# Patient Record
Sex: Male | Born: 1955 | Race: White | Hispanic: No | Marital: Single | Smoking: Former smoker
Health system: Southern US, Community
[De-identification: ages and names within clinical notes are randomized; demographics above are authoritative.]

## PROBLEM LIST (undated history)

## (undated) DIAGNOSIS — J40 Bronchitis, not specified as acute or chronic: Secondary | ICD-10-CM

## (undated) DIAGNOSIS — I10 Essential (primary) hypertension: Secondary | ICD-10-CM

## (undated) DIAGNOSIS — G4700 Insomnia, unspecified: Secondary | ICD-10-CM

## (undated) DIAGNOSIS — E559 Vitamin D deficiency, unspecified: Secondary | ICD-10-CM

## (undated) DIAGNOSIS — E782 Mixed hyperlipidemia: Secondary | ICD-10-CM

## (undated) DIAGNOSIS — J329 Chronic sinusitis, unspecified: Secondary | ICD-10-CM

## (undated) HISTORY — DX: Mixed hyperlipidemia: E78.2

## (undated) HISTORY — DX: Essential (primary) hypertension: I10

## (undated) HISTORY — DX: Vitamin D deficiency, unspecified: E55.9

## (undated) HISTORY — DX: Insomnia, unspecified: G47.00

## (undated) HISTORY — DX: Chronic sinusitis, unspecified: J32.9

## (undated) HISTORY — PX: ROTATOR CUFF REPAIR: SHX139

## (undated) HISTORY — DX: Bronchitis, not specified as acute or chronic: J40

---

## 2000-11-09 ENCOUNTER — Emergency Department (HOSPITAL_COMMUNITY): Admission: EM | Admit: 2000-11-09 | Discharge: 2000-11-10 | Payer: Self-pay | Admitting: *Deleted

## 2000-11-10 ENCOUNTER — Encounter: Payer: Self-pay | Admitting: Emergency Medicine

## 2000-11-22 ENCOUNTER — Inpatient Hospital Stay (HOSPITAL_COMMUNITY): Admission: RE | Admit: 2000-11-22 | Discharge: 2000-11-25 | Payer: Self-pay | Admitting: Specialist

## 2000-11-22 ENCOUNTER — Encounter: Payer: Self-pay | Admitting: Specialist

## 2005-02-05 ENCOUNTER — Ambulatory Visit (HOSPITAL_COMMUNITY): Admission: RE | Admit: 2005-02-05 | Discharge: 2005-02-05 | Payer: Self-pay | Admitting: Family Medicine

## 2005-02-24 ENCOUNTER — Encounter: Admission: RE | Admit: 2005-02-24 | Discharge: 2005-02-24 | Payer: Self-pay | Admitting: Neurosurgery

## 2005-03-08 ENCOUNTER — Encounter: Admission: RE | Admit: 2005-03-08 | Discharge: 2005-03-08 | Payer: Self-pay | Admitting: Neurosurgery

## 2005-03-26 ENCOUNTER — Encounter: Admission: RE | Admit: 2005-03-26 | Discharge: 2005-03-26 | Payer: Self-pay | Admitting: Neurosurgery

## 2008-03-01 ENCOUNTER — Ambulatory Visit (HOSPITAL_COMMUNITY): Admission: RE | Admit: 2008-03-01 | Discharge: 2008-03-01 | Payer: Self-pay | Admitting: Gastroenterology

## 2008-03-01 ENCOUNTER — Encounter (INDEPENDENT_AMBULATORY_CARE_PROVIDER_SITE_OTHER): Payer: Self-pay | Admitting: Gastroenterology

## 2010-08-04 NOTE — Op Note (Signed)
NAME:  Hector Ryan, Hector Ryan                 ACCOUNT NO.:  [id number]   MEDICAL RECORD NO.:  [id number]          PATIENT TYPE:  AMB   LOCATION:  ENDO                         FACILITY:  [facility]   PHYSICIAN:  John C. Madilyn Fireman, M.D.    DATE OF BIRTH:  Jan 12, 1956   DATE OF PROCEDURE:  03/01/2008  DATE OF DISCHARGE:                               OPERATIVE REPORT   INDICATIONS FOR PROCEDURE:  History of adenomatous colon polyps with  last colonoscopy 5 years ago.   PROCEDURE:  The patient was placed in the left lateral decubitus  position and placed on the pulse monitor with continuous low-flow oxygen  delivered by nasal cannula.  He was sedated with 100 mcg IV fentanyl and  10 mg IV Versed.  Olympus video colonoscope was inserted into the rectum  and advanced to the cecum, confirmed by transillumination of McBurney's  point and visualization of ileocecal valve and appendiceal orifice.  The  prep was excellent.  In the cecum, there was a 4-mm polyp that was  fulgurated by hot biopsy.  The ascending, transverse, descending, and  sigmoid colon appeared normal with no further polyps, masses,  diverticula or other mucosal abnormalities.  At the rectosigmoid  junction, there were 2 small 4-mm polyps that were both fulgurated by  hot biopsy.  The scope was then withdrawn.  The patient returned to the  recovery room in stable condition.  He tolerated the procedure well, and  there were no immediate complications.   IMPRESSION:  Three colon polyps.   PLAN:  Await histology and will probably repeat colonoscopy in 3-5  years.           ______________________________  Everardo All Madilyn Fireman, M.D.     J[facility]/MEDQ  D:  03/01/2008  T:  03/02/2008  Job:  [id number]   cc:   Bryan Lemma. Manus Gunning, M.D.  Fax: [phone]

## 2010-08-07 NOTE — Op Note (Signed)
[place]. [facility]  Patient:    Hector Ryan, Hector Ryan Visit Number: [id number] MRN: [id number]          Service Type: SUR Location: 5000 [id number] Attending Physician:  Erasmo Leventhal Dictated by:   R. Valma Cava, M.D. Proc. Date: 11/22/00 Admit Date:  11/22/2000                             Operative Report  PREOPERATIVE DIAGNOSIS:  Right shoulder comminuted displaced unstable mid one-third diaphyseal fracture.  POSTOPERATIVE DIAGNOSIS:  Right shoulder comminuted displaced unstable mid one-third diaphyseal fracture.  OPERATION:  Open reduction internal fixation of the right clavicle fracture utilizing Dupuy intramedullary clavicle pins.  SURGEON:  R. Valma Cava, M.D.  ASSISTANT:  Dorie Rank, P.A.  ANESTHESIA:  General.  ESTIMATED BLOOD LOSS:  Less than 50 cc.  DRAINS:  None.  COMPLICATIONS:  None.  DISPOSITION:  To PACU stable.  DESCRIPTION OF PROCEDURE:  The patient was counseled in the holding area and correct shoulder was identified.  Intravenous shoulder block was given.  I again went through the pros and cons of the surgery with the patient, and he adamantly wanted to continue along with open reduction and internal fixation despite the increasing risks and potential complications.  Based on this, he was then taking to the operating room and placed on the operating room table under general anesthesia.  He was then placed in modified beach chair position.  The right shoulder was examined.  The clavicle was beginning to heal.  He had callus in place.  The C-arm was brought in from the head of the table to make sure that we could see the fracture site, and we could.  Prepped with DuraPrep and draped in a sterile fashion.  The overlying skin was anesthetized with Marcaine with epinephrine.  A longitudinal incision was made based upon the clavicle.  The skin and subcutaneous tissues and skin flaps were mobilized. The platysma  muscle was identified.  It was opened longitudinally in its fibers.  The middle supraclavicular nerve was protected throughout the entire case and retracted out of the way.  At this point in time, there was fracture of callous, early flap wound was present, and a meticulous dissection was done around a medial fragment.  It was markedly comminuted, and also there was one fragment turned 90 degrees on itself and entrapped in soft tissue.  It was gently mobilized leaving as much soft tissue attachment as possible.  Laterally, the lateral fragment was also identified and mobilized.  Throughout the entire time, the neurovascular bundles and the close proximity of these major vessel structures were thought of and protected.  The subperiosteum and subclavius muscle were used to protect the neurovascular structures.  A curet was then utilized to open the canal of the medial fragment.  I checked it with the C-arm.  I then made sure it was in the canal and then up to the third reamer to the appropriate depth under direct visualization hand reaming this very slowly and cautiously making sure it did not penetrate the cortex, and I did not.  Attention was then directed to the lateral side where the same size drill bit was then drilled through this and out through a posterior lateral puncture wound.  In addition to the medial side, we had tapped that to the appropriate size to the appropriate level.  All of this was done under image intensification  guidance.  The appropriate size pin was then drilled from medial to lateral at the proximal fragment at the puncture wound.  The fracture was reduced as anatomically as possibly and then it was advanced back across the medial fragment given a nice stable fixation.  It was taken to the appropriate depth and again checked with the C-arm.  The fracture was reduced as well as possible regaining the length of the fracture.  The two bolts were then applied  utilizing standard fashion laterally to help assist this and an ancillary incision made and split the trapezius.  These were taken down to the clavicle where there was compression at the fracture site and then the other locking bolt was applied to this for a cold weld.  The shoulder was put through a gentle range of motion.  It appeared to be stable. The end of the pin was then cut underneath the skin.  At this time, three Vicryl sutures were placed around the clavicle and then the butterfly fragment was sutured back to the clavicle trying to close this down as tightly as possible.  The overlying periosteum and muscular periosteal cup was then reattached with Vicryl around the fracture site.  The platysma was closed with Vicryl and the supraclavicular nerves were then protected.  The subcu was closed with Vicryl, and the skin with staples.  The other two wounds were also closed with subcu Vicryl.  Skin closed with staples.  Another 20 cc of 0.5% Marcaine with epinephrine was placed at the wound edges to help with healing postoperatively.  Another 1 g of Ancef was given intravenously.  He had a normal pulse in the hand at the end of the case.  Sterile compressive dressing was applied to the shoulder, placed in shoulder immobilizer _____ across his abdomen.  Pulses were symmetric.  There were no apparent complications. Follow-up x-rays will be obtained in the PACU.  In addition, we got intraoperative x-rays to confirm excellent reduction of the fracture and placement of the implant.  No complications.  He was gently awakened and taken from the operating room to recovery in stable condition.    He was then taken up to his room for observation. Dictated by:   R. Valma Cava, M.D. Attending Physician:  Erasmo Leventhal DD:  11/22/00 TD:  11/22/00 Job: [id number]

## 2010-08-07 NOTE — Discharge Summary (Signed)
[place]. [facility]  Patient:    GIORGI, DEBRUIN Visit Number: [id number] MRN: [id number]          Service Type: MED Location: 5000 [id number] Attending Physician:  Erasmo Leventhal Dictated by:   Dorie Rank, P.A.C. Admit Date:  11/22/2000 Discharge Date: 11/25/2000                             Discharge Summary  ADMISSION DIAGNOSES: 1. Right clavicle fracture. 2. Wears contact lenses.  DISCHARGE DIAGNOSES: 1. Right clavicle fracture. 2. Wears contact lenses.  PROCEDURE:  On November 22, 2000, the patient underwent an open reduction and internal fixation of the right clavicle fracture utilizing DePuy intramedullary clavicle pins.  Surgeon was R. Valma Cava, M.D., assistant Dorie Rank, P.A.C.  Anesthesia was general.  COMPLICATIONS:  None.  CONSULTS:  None.  HISTORY OF PRESENT ILLNESS:  Mr. Nafziger is a pleasant 55 year old  male who has history of a right clavicle fracture.  He stated in the office that he wanted a reduction due to the instability of the fracture.  In the office he was counseled on the pros and cons of an ORIF versus conservative measures with immobilization.  The patient wished to proceed with an ORIF of the right clavicle fracture.  HOSPITAL COURSE:  The patient was admitted on November 22, 2000, and had the above stated procedure.  He tolerated surgery well.  While in the operating room the incision was dressed in a sterile fashion.  On postop day #1, the dressing was clean, dry and intact. The dressing was changed on postop day #2 and #3 and found to be free of any erythema or drainage.  Initially for pain control, he utilized a PCA of Marcaine.  He was weaned off of this and by postop day #3 he was utilizing p.o.s for pain control.  A regular diet was resumed postoperatively.  He tolerated p.o. intake well.  Ice packs were applied to his shoulder and changed q.2h.  He utilized the bedside incentive spirometer q. hour  7 a.m. to 10 p.m.  for 10 minutes.  A CBC and BMET were obtained on postop day #1 and he was found to be stable.  Neurovascular checks and vitals were obtained routinely per postop routine.  IV Ancef was used for postop infection control.  Occupational therapy worked with the patient on ADLs and pendulum exercises to the right shoulder. He utilized a sling to the right shoulder.  His elbow was kept elevated on a pillow while in bed. Calcium as well as vitamin C and vitamin D were started postoperatively.  By postop day #3, the patient was felt to be medically and orthopedically stable for discharge.  LABS:  CBC on November 22, 2000, was within normal limits.  CBC on November 23, 2000, was within normal limits.  Coag studies on November 22, 2000, including PT, INR and PTT were within normal limits.  BMET on November 22, 2000, was within normal limits.  BMET on November 23, 2000, was within normal limits.  EKG on November 22, 2000, tracings revealed sinus rhythm with marked sinus arrhythmias otherwise normal.  This was confirmed by Dr. Olga Millers.  RADIOLOGY:  A 2-view chest x-ray on November 10, 2000, revealed no acute cardiopulmonary findings.  On November 22, 2000, intraoperative C-arm fluoroscopy views revealed open reduction and internal fixation right clavicle fracture.  Postoperative clavicle films on November 22, 2000, revealed status  post open reduction and internal fixation right clavicle fracture.  CONDITION ON DISCHARGE:  Improved.  DISCHARGE PLANS AND MEDICATIONS:  The patient was discharged home.  Daily dressing changes.  He was to keep his arm in a sling.  No motion to the right shoulder except pendulum exercises only.  He was given prescriptions for Percocet and Robaxin.  He was instructed to take over-the-counter vitamin C 1000 mg one p.o. q.d. and calcium 500 Plus D one p.o. t.i.d.  He is to follow up with Dr. Thomasena Edis in two weeks, call for an appointment, Tylenol  for fever. He is educated, he needs to  __________ his smoking.  He could shower on postop day #5. Dictated by:   Dorie Rank, P.A.C. Attending Physician:  Erasmo Leventhal DD:  12/26/00 TD:  12/26/00 Job: [id number]

## 2011-07-19 ENCOUNTER — Telehealth: Payer: Self-pay

## 2011-07-19 NOTE — Telephone Encounter (Signed)
LMOM that we do not have a record of a tdap for him.

## 2011-07-19 NOTE — Telephone Encounter (Signed)
Pt needs to know last tetanus date   Best phone [phone]

## 2012-12-29 ENCOUNTER — Other Ambulatory Visit: Payer: Self-pay | Admitting: Occupational Medicine

## 2012-12-29 ENCOUNTER — Ambulatory Visit: Payer: Self-pay

## 2012-12-29 DIAGNOSIS — R52 Pain, unspecified: Secondary | ICD-10-CM

## 2013-01-05 ENCOUNTER — Other Ambulatory Visit: Payer: Self-pay | Admitting: Occupational Medicine

## 2013-01-05 ENCOUNTER — Ambulatory Visit: Payer: Self-pay

## 2013-01-05 DIAGNOSIS — M549 Dorsalgia, unspecified: Secondary | ICD-10-CM

## 2013-03-13 ENCOUNTER — Other Ambulatory Visit: Payer: Self-pay | Admitting: Gastroenterology

## 2015-05-15 ENCOUNTER — Encounter: Payer: Self-pay | Admitting: Neurology

## 2015-05-15 ENCOUNTER — Ambulatory Visit (INDEPENDENT_AMBULATORY_CARE_PROVIDER_SITE_OTHER): Payer: BLUE CROSS/BLUE SHIELD | Admitting: Neurology

## 2015-05-15 VITALS — BP 140/88 | HR 82 | Resp 20 | Ht 66.0 in | Wt 212.0 lb

## 2015-05-15 DIAGNOSIS — G4710 Hypersomnia, unspecified: Secondary | ICD-10-CM | POA: Diagnosis not present

## 2015-05-15 DIAGNOSIS — J208 Acute bronchitis due to other specified organisms: Secondary | ICD-10-CM | POA: Diagnosis not present

## 2015-05-15 DIAGNOSIS — R0683 Snoring: Secondary | ICD-10-CM | POA: Insufficient documentation

## 2015-05-15 DIAGNOSIS — G4700 Insomnia, unspecified: Secondary | ICD-10-CM | POA: Diagnosis not present

## 2015-05-15 DIAGNOSIS — G4730 Sleep apnea, unspecified: Secondary | ICD-10-CM

## 2015-05-15 NOTE — Patient Instructions (Signed)
Please remember to try to maintain good sleep hygiene, which means: Keep a regular sleep and wake schedule, try not to exercise or have a meal within 2 hours of your bedtime, try to keep your bedroom conducive for sleep, that is, cool and dark, without light distractors such as an illuminated alarm clock, and refrain from watching TV right before sleep or in the middle of the night and do not keep the TV or radio on during the night. Also, try not to use or play on electronic devices at bedtime, such as your cell phone, tablet PC or laptop. If you like to read at bedtime on an electronic device, try to dim the background light as much as possible. Do not eat in the middle of the night.   We will request a sleep study.    We will look for leg twitching and snoring or sleep apnea.   For chronic insomnia, you are best followed by a psychiatrist and/or sleep psychologist.   We will call you with the sleep study results and make a follow up appointment if needed.      Diphenhydramine disintegrating tablet (Insomnia) What is this medicine?  BENADRYL  DIPHENHYDRAMINE (dye fen HYE dra meen) is an antihistamine. This medicine is used to treat occasional sleeplessness. This medicine may be used for other purposes; ask your health care provider or pharmacist if you have questions. What should I tell my health care provider before I take this medicine? They need to know if you have any of these conditions: -glaucoma -high blood pressure or heart disease -liver disease -lung or breathing disease, like asthma -pain or trouble passing urine -prostate trouble -ulcers or other stomach problems -an unusual or allergic reaction to diphenhydramine, other medicines, foods, dyes, or preservatives -pregnant or trying to get pregnant -breast-feeding How should I use this medicine? Take this medicine by mouth. Follow the directions on the label. Leave the tablet in the sealed blister pack until you are ready  to take it. With dry hands, open the blister and gently remove the tablet. If the tablet breaks or crumbles, throw it away and take a new tablet out of the blister pack. Place the tablet in the mouth and allow it to dissolve, and then swallow. While you may take these tablets with water, it is not necessary to do so. Do not take your medicine more often than directed. Talk to your pediatrician regarding the use of this medicine in children. While this drug may be prescribed for children as young as 65 years old for selected conditions, precautions do apply. Patients over 64 years old may have a stronger reaction and may need a smaller dose. Overdosage: If you think you have taken too much of this medicine contact a poison control center or emergency room at once. NOTE: This medicine is only for you. Do not share this medicine with others. What if I miss a dose? This does not apply; this medicine is not for regular use. Do not take double or extra doses. What may interact with this medicine? Do not take this medicine with any of the following medications: -MAOIs like Carbex, Eldepryl, Marplan, Nardil, and Parnate -other medicines that contain diphenhydramine This medicine may also interact with the following medications: -alcohol -barbiturates like phenobarbital -medicines for bladder spasm like oxybutynin, tolterodine -medicines for blood pressure -medicines for depression, anxiety, or psychotic disturbances -medicines for movement abnormalities or Parkinson's disease -medicines for sleep -other medicines for cold, cough, or allergy -some medicines  for the stomach like chlordiazepoxide, dicyclomine This list may not describe all possible interactions. Give your health care provider a list of all the medicines, herbs, non-prescription drugs, or dietary supplements you use. Also tell them if you smoke, drink alcohol, or use illegal drugs. Some items may interact with your medicine. What should I  watch for while using this medicine? Tell your doctor or health care professional if your symptoms do not start to get better or if they get worse. See your doctor if you have trouble sleeping every night for more than 2 weeks. Your mouth may get dry. Chewing sugarless gum or sucking hard candy, and drinking plenty of water may help. Contact your doctor if the problem does not go away or is severe. This medicine may cause dry eyes and blurred vision. If you wear contact lenses you may feel some discomfort. Lubricating drops may help. See your eye doctor if the problem does not go away or is severe. You may get drowsy or dizzy. Do not drive, use machinery, or do anything that needs mental alertness until you know how this medicine affects you. Do not stand or sit up quickly, especially if you are an older patient. This reduces the risk of dizzy or fainting spells. Alcohol may interfere with the effect of this medicine. Avoid alcoholic drinks. What side effects may I notice from receiving this medicine? Side effects that you should report to your doctor or health care professional as soon as possible: -allergic reactions like skin rash, itching or hives, swelling of the face, lips, or tongue -changes in vision -confusion -fast, irregular heartbeat -irritable -tremors -trouble passing urine or change in the amount of urine -unusual bleeding or bruising -unusually weak or tired Side effects that usually do not require medical attention (Report these to your doctor or health care professional if they continue or are bothersome.): -constipation -diarrhea -drowsy -headache -thick mucous -upset stomach This list may not describe all possible side effects. Call your doctor for medical advice about side effects. You may report side effects to FDA at 1-800-FDA-1088. Where should I keep my medicine? Keep out of the reach of children. Store at room temperature between 15 and 30 degrees C (59 and 86  degrees F). Keep in the original blister package until time of use. Throw away any unused medicine after the expiration date. NOTE: This sheet is a summary. It may not cover all possible information. If you have questions about this medicine, talk to your doctor, pharmacist, or health care provider.    2016, [employer]. (2009-10-27 21:15:53)

## 2015-05-15 NOTE — Progress Notes (Signed)
SLEEP MEDICINE CLINIC   Provider:  Larey Seat, M D  Referring Provider: Tamsen Roers, MD Primary Care Physician:  Tamsen Roers, MD  Chief Complaint  Patient presents with  . New Patient (Initial Visit)    snores at night, Dr. Rex Kras referral, rm 10, alone    HPI:  Hector Ryan is a 60 y.o. male , seen here as a referral from Dr. Rex Kras for a sleep medicine consultation,   Chief complaint according to patient :" I just don't sleep- I gasp, choke and snore". Hector Ryan is seen here today stating that he has a very poor sleep quality and at times just doesn't sleep at all. He has a history of 3 surgeries rotator cuff tear repair has hypertension and hypercholesterolemia he is also considered prediabetic. He does struggle with an upper respiratory infection. He often wakes up in the early morning hours at 1, 3 and 5 AM and he is snoring at about recent changes in his health insurance and upcoming changes and to Medicare. He states that his primary care physician with the [facility] dropped him after he lost his insurance and that he is happy to have found Dr. Rex Kras. He also stated that he had been 2 years out of work on [employer]. He used to work for [employer] and is now on a "pension" - financially limited. In short, he is worried. His girlfriend is reporting by phone he gasps for air every night, snores loudly. He has at least 3 bath room breaks.   Sleep habits are as follows: After the night's news he usually goes to bed that can be between 11:30 minute night sometimes even 1 AM. It will take him about 30-40 minutes to fall asleep. He usually watches TV in his living room and then transfers to the bedroom. He sleeps on 2 pillows one that gives him special neck shoulder support. He likes to sleep on his side but had for long-term trouble with his shoulder injury. Sometimes he finds himself when waking up on his back sometimes he finds himself waking up on his back. This is probably the  position in which  he snores the loudest .  He has a cat flap in the door and often his cat, [name], joins him. He shares the bedroom, which is cool, quiet and dark , with his girlfriend. He wakes spontaneously at 8 AM and needs no alarm, usually getting cumulative 4-5 hours of sleep. He takes one hour nap every PM. He was a shift Insurance underwriter.   Sleep medical history and family sleep history:  Unaware of sleep disorders. He rocked himself to sleep as a child.   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  Review of Systems: Out of a complete 14 system review, the patient complains of only the following symptoms, and all other reviewed systems are negative.   Epworth score 10 , Fatigue severity score 41  , depression score 6   Social History   Social History  . Marital Status: Single    Spouse Name: N/A  . Number of Children: N/A  . Years of Education: N/A   Occupational History  . Not on file.   Social History Main Topics  . Smoking status: Former Smoker    Quit date: 03/22/2008  . Smokeless tobacco: Not on file  . Alcohol Use: 8.4 oz/week    14 Standard drinks or equivalent per week  . Drug Use: No  . Sexual  Activity: Not on file   Other Topics Concern  . Not on file   Social History Narrative    Family History  Problem Relation Age of Onset  . Heart disease Mother   . Diabetes Father   . Hypertension Father     Past Medical History  Diagnosis Date  . Insomnia   . Vitamin D deficiency   . Hypertension   . Mixed hyperlipidemia   . Bronchitis   . Sinusitis     Past Surgical History  Procedure Laterality Date  . Rotator cuff repair      Current Outpatient Prescriptions  Medication Sig Dispense Refill  . albuterol (PROVENTIL HFA;VENTOLIN HFA) 108 (90 Base) MCG/ACT inhaler Inhale into the lungs every 6 (six) hours as needed for wheezing or shortness of breath.    Marland Kitchen amLODipine (NORVASC) 10 MG tablet Take 10 mg by mouth daily.      . ergocalciferol (VITAMIN D2) 50000 units capsule Take 50,000 Units by mouth once a week.    . fenofibrate 160 MG tablet Take 160 mg by mouth daily.    . finasteride (PROPECIA) 1 MG tablet Take 1 mg by mouth daily.  12  . Omega-3 Fatty Acids (FISH OIL PO) Take by mouth.    . sildenafil (REVATIO) 20 MG tablet Take 20 mg by mouth 3 (three) times daily.    . temazepam (RESTORIL) 15 MG capsule Take 15 mg by mouth at bedtime as needed for sleep.     No current facility-administered medications for this visit.    Allergies as of 05/15/2015 - Review Complete 05/15/2015  Allergen Reaction Noted  . Statins  05/13/2015    Vitals: BP 140/88 mmHg  Pulse 82  Resp 20  Ht 5\' 6"  (1.676 m)  Wt 212 lb (96.163 kg)  BMI 34.23 kg/m2 Last Weight:  Wt Readings from Last 1 Encounters:  05/15/15 212 lb (96.163 kg)   [id number] mass index is 34.23 kg/(m^2).     Last Height:   Ht Readings from Last 1 Encounters:  05/15/15 5\' 6"  (1.676 m)    Physical exam:  General: The patient is awake, alert and appears not in acute distress. The patient is well groomed. Head: Normocephalic, atraumatic. Neck is supple. Mallampati 4 , red and puffy- ,  neck circumference: 17.5 . Nasal airflow congested , TMJ is evident . Retrognathia is seen.  Cardiovascular:  Regular rate and rhythm, without  murmurs or carotid bruit, and without distended neck veins. Respiratory: bronchial breathing, cough, productive.  Skin:  Without evidence of edema, or rash Trunk: The patient's posture is erect   Neurologic exam : The patient is awake and alert, oriented to place and time.   Memory subjective described as intact.   Attention span & concentration ability appears normal.  Speech is fluent,  without dysarthria, but dysphonia.  Mood and affect are appropriate.  Cranial nerves: Pupils are equal and briskly reactive to light. Funduscopic exam withoutevidence of pallor or edema. Extraocular movements  in vertical and horizontal  planes intact and without nystagmus. Visual fields by finger perimetry are intact. Hearing to finger rub intact.   Facial sensation intact to fine touch.  Facial motor strength is symmetric and tongue and uvula move midline. Shoulder shrug was symmetrical.   Motor exam:   Normal tone, muscle bulk and symmetric strength in all extremities.  Sensory:  Fine touch, pinprick and vibration - was normal.  Coordination: Rapid alternating movements / Finger-to-nose maneuver normal.   Gait and station:  Patient walks without assistive device and is able unassisted to climb up to the exam table. Strength within normal limits.  Stance is stable and normal.   Deep tendon reflexes: in the  upper and lower extremities are symmetric and intact. Babinski maneuver response is downgoing.  The patient was advised of the nature of the diagnosed sleep disorder , the treatment options and risks for general a health and wellness arising from not treating the condition.  I spent more than  40 minutes of face to face time with the patient. Greater than 50% of time was spent in counseling and coordination of care. We have discussed the diagnosis and differential and I answered the patient's questions.     Assessment:  After physical and neurologic examination, review of laboratory studies,  Personal review of imaging studies, reports of other /same  Imaging studies ,  Results of polysomnography/ neurophysiology testing and pre-existing records as far as provided in visit., my assessment is   1) He is not as exercise tolerant and easily fatigued but also short of breath , just overcame URI.   2) he has qualitatively and quantitatively not enough sleep. He craves more sleep, but struggles with insomnia and frequent sleep interruptions his sleep is fragmented by nocturia and by respiratory arousals as described by his girlfriend.  3) Mr. Holtorf is under lot of different stressors including social and financial worries  related to his upcoming transition into Medicare. This is a role in his inability to initiate sleep when he truly feels that he is tired enough to do so.he is eager to address the suspected OSA .    Plan:  Treatment plan and additional workup :   Insomnia, treat with BENADRYL 25 mg one hour before intended bedtime. 10 o'clock .  Please remember to try to maintain good sleep hygiene, which means: Keep a regular sleep and wake schedule, try not to exercise or have a meal within 2 hours of your bedtime, try to keep your bedroom conducive for sleep, that is, cool and dark, without light distractors such as an illuminated alarm clock, and refrain from watching TV right before sleep or in the middle of the night and do not keep the TV or radio on during the night. Also, try not to use or play on electronic devices at bedtime, such as your cell phone, tablet PC or laptop. If you like to read at bedtime on an electronic device, try to dim the background light as much as possible. Do not eat in the middle of the night.   We will request a sleep study.    We will look for leg twitching and snoring or sleep apnea.   For chronic insomnia, you are best followed by a psychiatrist and/or sleep psychologist.   We will call you with the sleep study results and make a follow up appointment if needed.   I thank Dr Rex Kras for making the arrangements and referral for this pleasant patient.   Asencion Partridge Hashim Eichhorst MD  05/15/2015   CC: Tamsen Roers, [place], [place] [address]

## 2015-05-21 ENCOUNTER — Ambulatory Visit (INDEPENDENT_AMBULATORY_CARE_PROVIDER_SITE_OTHER): Payer: BLUE CROSS/BLUE SHIELD | Admitting: Neurology

## 2015-05-21 DIAGNOSIS — G4700 Insomnia, unspecified: Secondary | ICD-10-CM

## 2015-05-21 DIAGNOSIS — G4730 Sleep apnea, unspecified: Secondary | ICD-10-CM

## 2015-05-21 DIAGNOSIS — G4710 Hypersomnia, unspecified: Secondary | ICD-10-CM

## 2015-05-22 NOTE — Sleep Study (Signed)
Please see the scanned sleep study interpretation located in the procedure tab in the chart view section.  

## 2015-05-29 ENCOUNTER — Telehealth: Payer: Self-pay

## 2015-05-29 NOTE — Telephone Encounter (Signed)
I called pt to discuss sleep study results. No answer, left a message asking him to call me back. If pt calls back, please advise him that I will return his call on Monday.

## 2015-06-02 ENCOUNTER — Telehealth: Payer: Self-pay

## 2015-06-02 NOTE — Telephone Encounter (Signed)
This patient returned your call, he can be reached at [phone]

## 2015-06-03 NOTE — Telephone Encounter (Signed)
I called pt again and left another message asking him to call me back. He left a message with our sleep lab late morning asking that I give him a call this afternoon at the home number listed to discuss his sleep study results since he is having a colonoscopy tomorrow.

## 2015-06-03 NOTE — Telephone Encounter (Signed)
I called pt again, no answer.

## 2015-06-03 NOTE — Telephone Encounter (Signed)
I called pt again to discuss sleep study results. No answer, left a message asking him to call me back. 

## 2015-06-04 ENCOUNTER — Other Ambulatory Visit: Payer: Self-pay | Admitting: Gastroenterology

## 2015-06-04 NOTE — Telephone Encounter (Signed)
Spoke to pt and advised him that his sleep study did not reveal significant sleep apnea. There were PLMS of sleep resulting in sleep disruption and likely related to pain, and mostly affected left leg during supine sleep. I advised pt to lose weight, diet, and exercise if not contraindicated by his other physicians. Pt said that he is working on losing weight. I advised him that we could refer him to a sleep dentist for snoring if he wants a dental device. I advised him to improve his sleep hygiene and aim for 7 hours of nocturnal sleep and that sleep aids such as unisom and benadryl are allowed but to avoid caffeine containing beverages and chocolate. I advised him that we can check for possible RLS at his follow up visit. Appt made for 3/27 at 8:30. Pt's insurance runs out on 06/21/15.

## 2015-06-16 ENCOUNTER — Encounter: Payer: Self-pay | Admitting: Neurology

## 2015-06-16 ENCOUNTER — Telehealth: Payer: Self-pay

## 2015-06-16 ENCOUNTER — Ambulatory Visit (INDEPENDENT_AMBULATORY_CARE_PROVIDER_SITE_OTHER): Payer: BLUE CROSS/BLUE SHIELD | Admitting: Neurology

## 2015-06-16 VITALS — BP 124/98 | HR 88 | Resp 20 | Ht 66.0 in | Wt 206.0 lb

## 2015-06-16 DIAGNOSIS — F458 Other somatoform disorders: Secondary | ICD-10-CM

## 2015-06-16 DIAGNOSIS — G4761 Periodic limb movement disorder: Secondary | ICD-10-CM | POA: Diagnosis not present

## 2015-06-16 DIAGNOSIS — R0683 Snoring: Secondary | ICD-10-CM | POA: Diagnosis not present

## 2015-06-16 NOTE — Telephone Encounter (Signed)
Pt insisted that I fax the referral for a dental device to Dr. Bryon Lions office right now. He has an appt with them tomorrow. I faxed the referral to Dr. Bryon Lions office.

## 2015-06-16 NOTE — Progress Notes (Signed)
SLEEP MEDICINE CLINIC   Provider:  Larey Seat, M D  Referring Provider: Tamsen Roers, MD Primary Care Physician:  Tamsen Roers, MD  Chief Complaint  Patient presents with  . Follow-up    pt was mad about paying co-pay, cursed at front desk, here to follow up on sleep study, rm 10, alone    HPI:  Hector Ryan is a 60 y.o. male , seen here as a referral from Dr. Rex Kras for a sleep medicine consultation,   Chief complaint according to patient :" I just don't sleep- I gasp, choke and snore". Hector Ryan is seen here today stating that he has a very poor sleep quality and at times just doesn't sleep at all. He has a history of 3 surgeries rotator cuff tear repair has hypertension and hypercholesterolemia he is also considered prediabetic. He does struggle with an upper respiratory infection. He often wakes up in the early morning hours at 1, 3 and 5 AM and he is snoring at about recent changes in his health insurance and upcoming changes and to Medicare. He states that his primary care physician with the [facility] dropped him after he lost his insurance and that he is happy to have found Dr. Rex Kras. He also stated that he had been 2 years out of work on [employer]. He used to work for [employer] and is now on a "pension" - financially limited. In short, he is worried. His girlfriend is reporting by phone he gasps for air every night, snores loudly. He has at least 3 bath room breaks.  Sleep habits are as follows: After the night's news he usually goes to bed that can be between 11:30 minute night sometimes even 1 AM. It will take him about 30-40 minutes to fall asleep. He usually watches TV in his living room and then transfers to the bedroom. He sleeps on 2 pillows one that gives him special neck shoulder support. He likes to sleep on his side but had for long-term trouble with his shoulder injury. Sometimes he finds himself when waking up on his back sometimes he finds himself waking up on  his back. This is probably the position in which  he snores the loudest .  He has a cat flap in the door and often his cat, [name], joins him. He shares the bedroom, which is cool, quiet and dark , with his girlfriend. He wakes spontaneously at 8 AM and needs no alarm, usually getting cumulative 4-5 hours of sleep. He takes one hour nap every PM. He was a shift Insurance underwriter.  Sleep medical history and family sleep history:  Unaware of sleep disorders. He rocked himself to sleep as a child.  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   Interval history from 06/16/2015. I have the pleasure of seeing Hector Ryan today again after his recent polysomnography. The patient had virtually no apnea except for a brief bout during REM sleep. There was one obstructive apnea and 1 central apnea noted for the whole night but he doesn't sleep supine there was no apnea he did have frequent periodic limb movements and he woke up from periodic limb movements about 4.8 times per hour of sleep he also had a significant amount of oxygen desaturation at about 50 minutes total at night. We discussed today if treatment for PLMS is necessary and if these correlate with restless leg syndrome. His RLS questionnaire was endorsed at medium range. [facility] restless leg syndrome  quality of life questionnaire was endorsed at very low impact. Nocturia 3 times at night.     Review of Systems: Out of a complete 14 system review, the patient complains of only the following symptoms, and all other reviewed systems are negative. Epworth score 9 from  10 , Fatigue severity score  27 from 41  , depression score 6, no treatment initiated.  Patient rocked himself to sleep.    Social History   Social History  . Marital Status: Single    Spouse Name: N/A  . Number of Children: N/A  . Years of Education: N/A   Occupational History  . Not on file.   Social History Main Topics  . Smoking status: Former  Smoker    Quit date: 03/22/2008  . Smokeless tobacco: Not on file  . Alcohol Use: 8.4 oz/week    14 Standard drinks or equivalent per week  . Drug Use: No  . Sexual Activity: Not on file   Other Topics Concern  . Not on file   Social History Narrative    Family History  Problem Relation Age of Onset  . Heart disease Mother   . Diabetes Father   . Hypertension Father     Past Medical History  Diagnosis Date  . Insomnia   . Vitamin D deficiency   . Hypertension   . Mixed hyperlipidemia   . Bronchitis   . Sinusitis     Past Surgical History  Procedure Laterality Date  . Rotator cuff repair      Current Outpatient Prescriptions  Medication Sig Dispense Refill  . albuterol (PROVENTIL HFA;VENTOLIN HFA) 108 (90 Base) MCG/ACT inhaler Inhale into the lungs every 6 (six) hours as needed for wheezing or shortness of breath.    Marland Kitchen amLODipine (NORVASC) 10 MG tablet Take 10 mg by mouth daily.    . ergocalciferol (VITAMIN D2) 50000 units capsule Take 50,000 Units by mouth once a week.    . fenofibrate 160 MG tablet Take 160 mg by mouth daily.    . finasteride (PROPECIA) 1 MG tablet Take 1 mg by mouth daily.  12  . Omega-3 Fatty Acids (FISH OIL PO) Take by mouth.    . Red Yeast Rice Extract (RED YEAST RICE PO) Take by mouth.    . sildenafil (REVATIO) 20 MG tablet Take 20 mg by mouth 3 (three) times daily.    . temazepam (RESTORIL) 15 MG capsule Take 15 mg by mouth at bedtime as needed for sleep.     No current facility-administered medications for this visit.    Allergies as of 06/16/2015 - Review Complete 06/16/2015  Allergen Reaction Noted  . Statins  05/13/2015    Vitals: BP 124/98 mmHg  Pulse 88  Resp 20  Ht 5\' 6"  (1.676 m)  Wt 206 lb (93.441 kg)  BMI 33.27 kg/m2 Last Weight:  Wt Readings from Last 1 Encounters:  06/16/15 206 lb (93.441 kg)   [id number] mass index is 33.27 kg/(m^2).     Last Height:   Ht Readings from Last 1 Encounters:  06/16/15 5\' 6"  (1.676 m)      Physical exam:  General: The patient is awake, alert and appears not in acute distress. The patient is well groomed. Head: Normocephalic, atraumatic. Neck is supple. Mallampati 4 , red and puffy- ,  neck circumference: 17.5 . Nasal airflow congested , TMJ is evident . Retrognathia is seen.  Cardiovascular:  Regular rate and rhythm, without  murmurs or carotid bruit, and without  distended neck veins. Respiratory: bronchial breathing, cough, productive.  Skin:  Without evidence of edema, or rash Trunk: The patient's posture is erect   Neurologic exam : The patient is awake and alert, oriented to place and time.   Memory subjective described as intact.   Attention span & concentration ability appears normal.  Speech is fluent,  without dysarthria, but dysphonia.  Mood and affect are appropriate.  Cranial nerves: Pupils are equal and briskly reactive to light. Funduscopic exam withoutevidence of pallor or edema. Extraocular movements  in vertical and horizontal planes intact and without nystagmus. Visual fields by finger perimetry are intact. Hearing to finger rub intact. Facial sensation intact to fine touch. Facial motor strength is symmetric and tongue and uvula move midline. Shoulder shrug was symmetrical.   The patient was advised of the nature of the diagnosed sleep disorder , the treatment options and risks for general a health and wellness arising from not treating the condition.  I spent more than  20 minutes of face to face time with the patient. Greater than 50% of time was spent in counseling and coordination of care. We have discussed the diagnosis and differential and I answered the patient's questions.     Assessment:  After physical and neurologic examination, review of laboratory studies,  Personal review of imaging studies, reports of other /same  Imaging studies ,  Results of polysomnography/ neurophysiology testing and pre-existing records as far as provided in visit.,  my assessment is   1) He is not as exercise tolerant and easily fatigued but also short of breath , just overcame URI. His hypoxemia was 50 minutes. No apnea.   2) he has qualitatively and quantitatively not enough sleep. He craves more sleep, but struggles with insomnia and frequent sleep interruptions his sleep is fragmented by nocturia and by respiratory arousals as described by his girlfriend. He is doing better on Ibuprofen PM.   3) Mr. Affeldt is under lot of different stressors including social and financial worries related to his upcoming transition into Medicare. This is a role in his inability to initiate sleep when he truly feels that he is tired enough to do so. His PLM arousals may be a stress related finding. I advised that he may benefit from Vit B and iron.   4) snoring - can benefit from a dental device to move the lower jaw out, can address bruxism , too.     Plan:  Treatment plan and additional workup :   Insomnia, treat with BENADRYL 25 mg one hour before intended bedtime. 10 o'clock .  Please remember to try to maintain good sleep hygiene, which means: Keep a regular sleep and wake schedule, try not to exercise or have a meal within 2 hours of your bedtime, try to keep your bedroom conducive for sleep, that is, cool and dark, without light distractors such as an illuminated alarm clock, and refrain from watching TV right before sleep or in the middle of the night and do not keep the TV or radio on during the night. Also, try not to use or play on electronic devices at bedtime, such as your cell phone, tablet PC or laptop. If you like to read at bedtime on an electronic device, try to dim the background light as much as possible.  Do not eat in the middle of the night.  For chronic insomnia, you are best followed by a psychiatrist and/or sleep psychologist.   I thank Dr Rex Kras for making the  arrangements and referral for this pleasant patient.   Asencion Partridge Logen Heintzelman  MD  06/16/2015   CC: Tamsen Roers, [place], [place] [address]

## 2015-06-17 ENCOUNTER — Encounter: Payer: Self-pay | Admitting: *Deleted

## 2017-10-19 DIAGNOSIS — M79601 Pain in right arm: Secondary | ICD-10-CM | POA: Diagnosis not present

## 2017-10-19 DIAGNOSIS — M5412 Radiculopathy, cervical region: Secondary | ICD-10-CM | POA: Diagnosis not present

## 2018-03-30 DIAGNOSIS — Z0000 Encounter for general adult medical examination without abnormal findings: Secondary | ICD-10-CM | POA: Diagnosis not present

## 2018-03-30 DIAGNOSIS — G479 Sleep disorder, unspecified: Secondary | ICD-10-CM | POA: Diagnosis not present

## 2018-03-30 DIAGNOSIS — E782 Mixed hyperlipidemia: Secondary | ICD-10-CM | POA: Diagnosis not present

## 2018-03-30 DIAGNOSIS — I10 Essential (primary) hypertension: Secondary | ICD-10-CM | POA: Diagnosis not present

## 2018-03-30 DIAGNOSIS — Z049 Encounter for examination and observation for unspecified reason: Secondary | ICD-10-CM | POA: Diagnosis not present

## 2018-03-30 DIAGNOSIS — L659 Nonscarring hair loss, unspecified: Secondary | ICD-10-CM | POA: Diagnosis not present

## 2018-04-04 DIAGNOSIS — R093 Abnormal sputum: Secondary | ICD-10-CM | POA: Diagnosis not present

## 2018-04-04 DIAGNOSIS — H6693 Otitis media, unspecified, bilateral: Secondary | ICD-10-CM | POA: Diagnosis not present

## 2018-04-04 DIAGNOSIS — R0982 Postnasal drip: Secondary | ICD-10-CM | POA: Diagnosis not present

## 2018-04-04 DIAGNOSIS — R05 Cough: Secondary | ICD-10-CM | POA: Diagnosis not present

## 2018-04-04 DIAGNOSIS — J029 Acute pharyngitis, unspecified: Secondary | ICD-10-CM | POA: Diagnosis not present

## 2018-04-04 DIAGNOSIS — J0140 Acute pansinusitis, unspecified: Secondary | ICD-10-CM | POA: Diagnosis not present

## 2018-04-04 DIAGNOSIS — R5383 Other fatigue: Secondary | ICD-10-CM | POA: Diagnosis not present

## 2018-04-04 DIAGNOSIS — Z1389 Encounter for screening for other disorder: Secondary | ICD-10-CM | POA: Diagnosis not present

## 2018-10-31 DIAGNOSIS — L820 Inflamed seborrheic keratosis: Secondary | ICD-10-CM | POA: Diagnosis not present

## 2018-10-31 DIAGNOSIS — L821 Other seborrheic keratosis: Secondary | ICD-10-CM | POA: Diagnosis not present

## 2018-10-31 DIAGNOSIS — D2372 Other benign neoplasm of skin of left lower limb, including hip: Secondary | ICD-10-CM | POA: Diagnosis not present

## 2018-10-31 DIAGNOSIS — D225 Melanocytic nevi of trunk: Secondary | ICD-10-CM | POA: Diagnosis not present

## 2018-10-31 DIAGNOSIS — Z1283 Encounter for screening for malignant neoplasm of skin: Secondary | ICD-10-CM | POA: Diagnosis not present

## 2018-10-31 DIAGNOSIS — D3613 Benign neoplasm of peripheral nerves and autonomic nervous system of lower limb, including hip: Secondary | ICD-10-CM | POA: Diagnosis not present

## 2018-11-09 DIAGNOSIS — H524 Presbyopia: Secondary | ICD-10-CM | POA: Diagnosis not present

## 2018-11-09 DIAGNOSIS — H5203 Hypermetropia, bilateral: Secondary | ICD-10-CM | POA: Diagnosis not present

## 2018-11-09 DIAGNOSIS — H2513 Age-related nuclear cataract, bilateral: Secondary | ICD-10-CM | POA: Diagnosis not present

## 2019-01-10 DIAGNOSIS — Z0189 Encounter for other specified special examinations: Secondary | ICD-10-CM | POA: Diagnosis not present

## 2019-01-10 DIAGNOSIS — I10 Essential (primary) hypertension: Secondary | ICD-10-CM | POA: Diagnosis not present

## 2019-01-10 DIAGNOSIS — E119 Type 2 diabetes mellitus without complications: Secondary | ICD-10-CM | POA: Diagnosis not present

## 2019-01-10 DIAGNOSIS — E291 Testicular hypofunction: Secondary | ICD-10-CM | POA: Diagnosis not present

## 2019-01-10 DIAGNOSIS — R7989 Other specified abnormal findings of blood chemistry: Secondary | ICD-10-CM | POA: Diagnosis not present

## 2019-01-10 DIAGNOSIS — E782 Mixed hyperlipidemia: Secondary | ICD-10-CM | POA: Diagnosis not present

## 2019-01-17 DIAGNOSIS — E291 Testicular hypofunction: Secondary | ICD-10-CM | POA: Diagnosis not present

## 2019-02-19 DIAGNOSIS — E291 Testicular hypofunction: Secondary | ICD-10-CM | POA: Diagnosis not present

## 2019-04-02 DIAGNOSIS — G4700 Insomnia, unspecified: Secondary | ICD-10-CM | POA: Diagnosis not present

## 2019-04-02 DIAGNOSIS — J309 Allergic rhinitis, unspecified: Secondary | ICD-10-CM | POA: Diagnosis not present

## 2019-04-02 DIAGNOSIS — E291 Testicular hypofunction: Secondary | ICD-10-CM | POA: Diagnosis not present

## 2019-05-01 DIAGNOSIS — L659 Nonscarring hair loss, unspecified: Secondary | ICD-10-CM | POA: Diagnosis not present

## 2019-05-01 DIAGNOSIS — G4700 Insomnia, unspecified: Secondary | ICD-10-CM | POA: Diagnosis not present

## 2019-05-01 DIAGNOSIS — G479 Sleep disorder, unspecified: Secondary | ICD-10-CM | POA: Diagnosis not present

## 2019-05-01 DIAGNOSIS — Z0000 Encounter for general adult medical examination without abnormal findings: Secondary | ICD-10-CM | POA: Diagnosis not present

## 2019-05-01 DIAGNOSIS — E119 Type 2 diabetes mellitus without complications: Secondary | ICD-10-CM | POA: Diagnosis not present

## 2019-05-01 DIAGNOSIS — G894 Chronic pain syndrome: Secondary | ICD-10-CM | POA: Diagnosis not present

## 2019-05-01 DIAGNOSIS — E291 Testicular hypofunction: Secondary | ICD-10-CM | POA: Diagnosis not present

## 2019-05-01 DIAGNOSIS — I10 Essential (primary) hypertension: Secondary | ICD-10-CM | POA: Diagnosis not present

## 2019-05-01 DIAGNOSIS — E559 Vitamin D deficiency, unspecified: Secondary | ICD-10-CM | POA: Diagnosis not present

## 2019-05-01 DIAGNOSIS — Z125 Encounter for screening for malignant neoplasm of prostate: Secondary | ICD-10-CM | POA: Diagnosis not present

## 2019-05-01 DIAGNOSIS — E782 Mixed hyperlipidemia: Secondary | ICD-10-CM | POA: Diagnosis not present

## 2019-05-01 DIAGNOSIS — F419 Anxiety disorder, unspecified: Secondary | ICD-10-CM | POA: Diagnosis not present

## 2019-06-18 DIAGNOSIS — J189 Pneumonia, unspecified organism: Secondary | ICD-10-CM | POA: Diagnosis not present

## 2019-06-18 DIAGNOSIS — J0140 Acute pansinusitis, unspecified: Secondary | ICD-10-CM | POA: Diagnosis not present

## 2019-06-18 DIAGNOSIS — R062 Wheezing: Secondary | ICD-10-CM | POA: Diagnosis not present

## 2019-06-18 DIAGNOSIS — R0602 Shortness of breath: Secondary | ICD-10-CM | POA: Diagnosis not present

## 2019-06-22 DIAGNOSIS — Z23 Encounter for immunization: Secondary | ICD-10-CM | POA: Diagnosis not present

## 2019-07-13 DIAGNOSIS — Z23 Encounter for immunization: Secondary | ICD-10-CM | POA: Diagnosis not present

## 2019-09-18 DIAGNOSIS — L820 Inflamed seborrheic keratosis: Secondary | ICD-10-CM | POA: Diagnosis not present

## 2019-09-18 DIAGNOSIS — L905 Scar conditions and fibrosis of skin: Secondary | ICD-10-CM | POA: Diagnosis not present

## 2019-09-18 DIAGNOSIS — L821 Other seborrheic keratosis: Secondary | ICD-10-CM | POA: Diagnosis not present

## 2019-09-18 DIAGNOSIS — Z1283 Encounter for screening for malignant neoplasm of skin: Secondary | ICD-10-CM | POA: Diagnosis not present

## 2019-09-18 DIAGNOSIS — D485 Neoplasm of uncertain behavior of skin: Secondary | ICD-10-CM | POA: Diagnosis not present

## 2019-09-18 DIAGNOSIS — L570 Actinic keratosis: Secondary | ICD-10-CM | POA: Diagnosis not present

## 2019-09-18 DIAGNOSIS — D225 Melanocytic nevi of trunk: Secondary | ICD-10-CM | POA: Diagnosis not present

## 2019-12-18 DIAGNOSIS — L820 Inflamed seborrheic keratosis: Secondary | ICD-10-CM | POA: Diagnosis not present

## 2019-12-18 DIAGNOSIS — B078 Other viral warts: Secondary | ICD-10-CM | POA: Diagnosis not present

## 2019-12-18 DIAGNOSIS — D225 Melanocytic nevi of trunk: Secondary | ICD-10-CM | POA: Diagnosis not present

## 2020-01-02 DIAGNOSIS — K645 Perianal venous thrombosis: Secondary | ICD-10-CM | POA: Diagnosis not present

## 2020-05-22 DIAGNOSIS — R0609 Other forms of dyspnea: Secondary | ICD-10-CM | POA: Diagnosis not present

## 2020-05-22 DIAGNOSIS — E119 Type 2 diabetes mellitus without complications: Secondary | ICD-10-CM | POA: Diagnosis not present

## 2020-05-22 DIAGNOSIS — Z0189 Encounter for other specified special examinations: Secondary | ICD-10-CM | POA: Diagnosis not present

## 2020-05-22 DIAGNOSIS — R7989 Other specified abnormal findings of blood chemistry: Secondary | ICD-10-CM | POA: Diagnosis not present

## 2020-05-22 DIAGNOSIS — E785 Hyperlipidemia, unspecified: Secondary | ICD-10-CM | POA: Diagnosis not present

## 2020-05-22 DIAGNOSIS — E559 Vitamin D deficiency, unspecified: Secondary | ICD-10-CM | POA: Diagnosis not present

## 2020-05-22 DIAGNOSIS — Z1329 Encounter for screening for other suspected endocrine disorder: Secondary | ICD-10-CM | POA: Diagnosis not present

## 2020-05-22 DIAGNOSIS — Z13228 Encounter for screening for other metabolic disorders: Secondary | ICD-10-CM | POA: Diagnosis not present

## 2020-05-22 DIAGNOSIS — R946 Abnormal results of thyroid function studies: Secondary | ICD-10-CM | POA: Diagnosis not present

## 2020-05-22 DIAGNOSIS — Z13220 Encounter for screening for lipoid disorders: Secondary | ICD-10-CM | POA: Diagnosis not present

## 2020-06-09 DIAGNOSIS — J019 Acute sinusitis, unspecified: Secondary | ICD-10-CM | POA: Diagnosis not present

## 2020-07-22 DIAGNOSIS — L910 Hypertrophic scar: Secondary | ICD-10-CM | POA: Diagnosis not present

## 2020-07-22 DIAGNOSIS — S30862A Insect bite (nonvenomous) of penis, initial encounter: Secondary | ICD-10-CM | POA: Diagnosis not present

## 2020-07-22 DIAGNOSIS — D225 Melanocytic nevi of trunk: Secondary | ICD-10-CM | POA: Diagnosis not present

## 2020-07-22 DIAGNOSIS — Z1283 Encounter for screening for malignant neoplasm of skin: Secondary | ICD-10-CM | POA: Diagnosis not present

## 2020-08-26 DIAGNOSIS — I10 Essential (primary) hypertension: Secondary | ICD-10-CM | POA: Diagnosis not present

## 2020-08-26 DIAGNOSIS — J22 Unspecified acute lower respiratory infection: Secondary | ICD-10-CM | POA: Diagnosis not present

## 2020-08-26 DIAGNOSIS — E119 Type 2 diabetes mellitus without complications: Secondary | ICD-10-CM | POA: Diagnosis not present

## 2020-09-30 DIAGNOSIS — B0089 Other herpesviral infection: Secondary | ICD-10-CM | POA: Diagnosis not present

## 2020-09-30 DIAGNOSIS — L820 Inflamed seborrheic keratosis: Secondary | ICD-10-CM | POA: Diagnosis not present

## 2020-10-16 ENCOUNTER — Ambulatory Visit
Admission: EM | Admit: 2020-10-16 | Discharge: 2020-10-16 | Disposition: A | Discharge status: Home / Self Care | Payer: PPO | Attending: Urgent Care | Admitting: Urgent Care

## 2020-10-16 ENCOUNTER — Ambulatory Visit (INDEPENDENT_AMBULATORY_CARE_PROVIDER_SITE_OTHER): Payer: PPO

## 2020-10-16 ENCOUNTER — Other Ambulatory Visit: Payer: Self-pay

## 2020-10-16 DIAGNOSIS — R0981 Nasal congestion: Secondary | ICD-10-CM

## 2020-10-16 DIAGNOSIS — R0989 Other specified symptoms and signs involving the circulatory and respiratory systems: Secondary | ICD-10-CM

## 2020-10-16 DIAGNOSIS — R059 Cough, unspecified: Secondary | ICD-10-CM | POA: Diagnosis not present

## 2020-10-16 DIAGNOSIS — Z20822 Contact with and (suspected) exposure to covid-19: Secondary | ICD-10-CM | POA: Diagnosis not present

## 2020-10-16 DIAGNOSIS — Z87891 Personal history of nicotine dependence: Secondary | ICD-10-CM

## 2020-10-16 DIAGNOSIS — J018 Other acute sinusitis: Secondary | ICD-10-CM

## 2020-10-16 MED ORDER — AMOXICILLIN 875 MG PO TABS: 875.0000 mg | ORAL_TABLET | Freq: Two times a day (BID) | ORAL | 0 refills | Status: DC

## 2020-10-16 NOTE — ED Provider Notes (Signed)
[place]   MRN: [id number] DOB: 12-28-1955  Subjective:   Hector Ryan is a 65 y.o. male presenting for 6-7 week history of persistent intermittent sinus congestion, sinus cold, cough but in the past week has worsened and moved into his chest. Has had COVID vaccination, has 1 booster.  He did have an office visit with his PCP and was prescribed azithromycin and prednisone course, feels like it did not make much of a difference.  Does not plan on returning to them and would like recommendations for a new primary care practice.  Has extensive smoking history of cigarettes, stopped in 2007. Has smoked marijuana daily since then, very light smoking.   No current facility-administered medications for this encounter.  Current Outpatient Medications:    albuterol (PROVENTIL HFA;VENTOLIN HFA) 108 (90 Base) MCG/ACT inhaler, Inhale into the lungs every 6 (six) hours as needed for wheezing or shortness of breath., Disp: , Rfl:    amLODipine (NORVASC) 10 MG tablet, Take 10 mg by mouth daily., Disp: , Rfl:    ergocalciferol (VITAMIN D2) 50000 units capsule, Take 50,000 Units by mouth once a week., Disp: , Rfl:    fenofibrate 160 MG tablet, Take 160 mg by mouth daily., Disp: , Rfl:    finasteride (PROPECIA) 1 MG tablet, Take 1 mg by mouth daily., Disp: , Rfl: 12   Omega-3 Fatty Acids (FISH OIL PO), Take by mouth., Disp: , Rfl:    Red Yeast Rice Extract (RED YEAST RICE PO), Take by mouth., Disp: , Rfl:    sildenafil (REVATIO) 20 MG tablet, Take 20 mg by mouth 3 (three) times daily., Disp: , Rfl:    temazepam (RESTORIL) 15 MG capsule, Take 15 mg by mouth at bedtime as needed for sleep., Disp: , Rfl:    Allergies  Allergen Reactions   Statins     Past Medical History:  Diagnosis Date   Bronchitis    Hypertension    Insomnia    Mixed hyperlipidemia    Sinusitis    Vitamin D deficiency      Past Surgical History:  Procedure Laterality Date   ROTATOR CUFF REPAIR      Family  History  Problem Relation Age of Onset   Heart disease Mother    Diabetes Father    Hypertension Father     Social History   Tobacco Use   Smoking status: Former    Types: Cigarettes    Quit date: 03/22/2008    Years since quitting: 12.5  Substance Use Topics   Alcohol use: Yes    Alcohol/week: 14.0 standard drinks    Types: 14 Standard drinks or equivalent per week   Drug use: No    ROS   Objective:   Vitals: BP (!) 155/76 (BP Location: Left Arm)   Pulse 85   Temp 98 F (36.7 C) (Oral)   Resp (!) 22   SpO2 97%   Physical Exam Constitutional:      General: He is not in acute distress.    Appearance: Normal appearance. He is well-developed. He is not ill-appearing, toxic-appearing or diaphoretic.  HENT:     Head: Normocephalic and atraumatic.     Right Ear: External ear normal.     Left Ear: External ear normal.     Nose: Nose normal.     Mouth/Throat:     Mouth: Mucous membranes are moist.     Pharynx: Oropharynx is clear.  Eyes:     General: No scleral icterus.  Extraocular Movements: Extraocular movements intact.     Pupils: Pupils are equal, round, and reactive to light.  Cardiovascular:     Rate and Rhythm: Normal rate and regular rhythm.     Heart sounds: Normal heart sounds. No murmur heard.   No friction rub. No gallop.  Pulmonary:     Effort: Pulmonary effort is normal. No respiratory distress.     Breath sounds: Normal breath sounds. No stridor. No wheezing, rhonchi or rales.  Neurological:     Mental Status: He is alert and oriented to person, place, and time.  Psychiatric:        Mood and Affect: Mood normal.        Behavior: Behavior normal.        Thought Content: Thought content normal.    DG Chest 2 View  Result Date: 10/16/2020 CLINICAL DATA:  Cough and congestion. EXAM: CHEST - 2 VIEW COMPARISON:  None. FINDINGS: The cardiac silhouette, mediastinal and hilar contours are normal. The lungs are clear. No pleural effusions. No pulmonary  lesions. The bony thorax is intact. IMPRESSION: No acute cardiopulmonary findings. Electronically Signed   By: Marijo Sanes M.D.   On: 10/16/2020 12:23     Assessment and Plan :   PDMP not reviewed this encounter.  1. Acute non-recurrent sinusitis of other sinus   2. Encounter for screening laboratory testing for COVID-19 virus   3. Cough   4. Chest congestion   5. Nasal congestion   6. History of smoking     Will start empiric treatment for sinusitis with amoxicillin.  Recommended supportive care otherwise including the use of oral antihistamine, decongestant. Counseled patient on potential for adverse effects with medications prescribed/recommended today, ER and return-to-clinic precautions discussed, patient verbalized understanding.    Jaynee Eagles, [place] 10/16/20 1232

## 2020-10-16 NOTE — ED Triage Notes (Signed)
Pt c/o cough, nasal congestion, and chest congestion since 6/7. States saw his PCP and was treated with z-pak and prednisone with no relief.

## 2020-10-17 ENCOUNTER — Telehealth (HOSPITAL_COMMUNITY): Payer: Self-pay | Admitting: Emergency Medicine

## 2020-10-17 LAB — NOVEL CORONAVIRUS, NAA: SARS-CoV-2, NAA: DETECTED — AB

## 2020-10-17 LAB — SARS-COV-2, NAA 2 DAY TAT

## 2020-10-17 MED ORDER — MOLNUPIRAVIR EUA 200MG CAPSULE: 4.0000 | ORAL_CAPSULE | Freq: Two times a day (BID) | ORAL | 0 refills | Status: AC

## 2020-10-17 NOTE — Telephone Encounter (Signed)
PEr Dr. Lacinda Axon okay to send molnupiravir

## 2020-10-24 ENCOUNTER — Ambulatory Visit
Admission: EM | Admit: 2020-10-24 | Discharge: 2020-10-24 | Disposition: A | Discharge status: Home / Self Care | Payer: PPO | Attending: Urgent Care | Admitting: Urgent Care

## 2020-10-24 ENCOUNTER — Encounter: Payer: Self-pay | Admitting: Emergency Medicine

## 2020-10-24 ENCOUNTER — Other Ambulatory Visit: Payer: Self-pay

## 2020-10-24 DIAGNOSIS — J329 Chronic sinusitis, unspecified: Secondary | ICD-10-CM

## 2020-10-24 DIAGNOSIS — U071 COVID-19: Secondary | ICD-10-CM

## 2020-10-24 MED ORDER — PREDNISONE 20 MG PO TABS: ORAL_TABLET | ORAL | 0 refills | Status: DC

## 2020-10-24 NOTE — ED Provider Notes (Signed)
Hector Ryan   MRN: [id number] DOB: [birth year]  Subjective:   Hector Ryan is a 65 y.o. male presenting for recheck on his COVID-19 symptoms.  Patient was last seen on 10/16/2020, had a positive COVID test result.  He subsequently was prescribed molnupiravir which she completed.  I also prescribed amoxicillin to address sinusitis.  He completed this course as well.  Today, he presents because he is still having symptoms.  Feels like he has not regressed but does not like that he has not completely improved.  Would like another medication.  Denies chest pain, shortness of breath, wheezing.  Also his symptoms are sinus congestion, postnasal drainage, sinus headaches, productive cough.  No current facility-administered medications for this encounter.  Current Outpatient Medications:    albuterol (PROVENTIL HFA;VENTOLIN HFA) 108 (90 Base) MCG/ACT inhaler, Inhale into the lungs every 6 (six) hours as needed for wheezing or shortness of breath., Disp: , Rfl:    amLODipine (NORVASC) 10 MG tablet, Take 10 mg by mouth daily., Disp: , Rfl:    amoxicillin (AMOXIL) 875 MG tablet, Take 1 tablet (875 mg total) by mouth 2 (two) times daily., Disp: 14 tablet, Rfl: 0   ergocalciferol (VITAMIN D2) 50000 units capsule, Take 50,000 Units by mouth once a week., Disp: , Rfl:    fenofibrate 160 MG tablet, Take 160 mg by mouth daily., Disp: , Rfl:    finasteride (PROPECIA) 1 MG tablet, Take 1 mg by mouth daily., Disp: , Rfl: 12   Omega-3 Fatty Acids (FISH OIL PO), Take by mouth., Disp: , Rfl:    Red Yeast Rice Extract (RED YEAST RICE PO), Take by mouth., Disp: , Rfl:    sildenafil (REVATIO) 20 MG tablet, Take 20 mg by mouth 3 (three) times daily., Disp: , Rfl:    temazepam (RESTORIL) 15 MG capsule, Take 15 mg by mouth at bedtime as needed for sleep., Disp: , Rfl:    Allergies  Allergen Reactions   Statins     Past Medical History:  Diagnosis Date   Bronchitis    Hypertension    Insomnia     Mixed hyperlipidemia    Sinusitis    Vitamin D deficiency      Past Surgical History:  Procedure Laterality Date   ROTATOR CUFF REPAIR      Family History  Problem Relation Age of Onset   Heart disease Mother    Diabetes Father    Hypertension Father     Social History   Tobacco Use   Smoking status: Former    Types: Cigarettes    Quit date: 03/22/2008    Years since quitting: 12.6  Substance Use Topics   Alcohol use: Yes    Alcohol/week: 14.0 standard drinks    Types: 14 Standard drinks or equivalent per week   Drug use: No    ROS   Objective:   Vitals: BP (!) 152/87 (BP Location: Left Arm)   Pulse 88   Temp 98.1 F (36.7 C) (Oral)   Resp 18   SpO2 95%   Physical Exam Constitutional:      General: He is not in acute distress.    Appearance: Normal appearance. He is well-developed. He is not ill-appearing, toxic-appearing or diaphoretic.  HENT:     Head: Normocephalic and atraumatic.     Right Ear: External ear normal.     Left Ear: External ear normal.     Nose: Nose normal.     Mouth/Throat:  Mouth: Mucous membranes are moist.     Pharynx: Oropharynx is clear.  Eyes:     General: No scleral icterus.    Extraocular Movements: Extraocular movements intact.     Pupils: Pupils are equal, round, and reactive to light.  Cardiovascular:     Rate and Rhythm: Normal rate and regular rhythm.     Heart sounds: Normal heart sounds. No murmur heard.   No friction rub. No gallop.  Pulmonary:     Effort: Pulmonary effort is normal. No respiratory distress.     Breath sounds: Normal breath sounds. No stridor. No wheezing, rhonchi or rales.  Neurological:     Mental Status: He is alert and oriented to person, place, and time.  Psychiatric:        Mood and Affect: Mood normal.        Behavior: Behavior normal.        Thought Content: Thought content normal.    Assessment and Plan :   PDMP not reviewed this encounter.  1. Recurrent sinusitis   2.  COVID-19     Offered patient a prednisone course as he has a history of recurrent sinusitis and already underwent a course of amoxicillin.  He also completed his COVID antiviral.  He has not really had the rebound effect therefore did not visit this with the patient. Counseled patient on potential for adverse effects with medications prescribed/recommended today, ER and return-to-clinic precautions discussed, patient verbalized understanding.    Jaynee Eagles, PA-C 10/24/20 1704

## 2020-10-24 NOTE — ED Triage Notes (Signed)
Recently seen for Covid symptoms. Patient is frustrated because he's still having covid symptoms. Requesting different prescription to clear up his covid.

## 2020-11-10 ENCOUNTER — Other Ambulatory Visit: Payer: Self-pay

## 2020-11-10 ENCOUNTER — Encounter: Payer: Self-pay | Admitting: Family Medicine

## 2020-11-10 ENCOUNTER — Ambulatory Visit (INDEPENDENT_AMBULATORY_CARE_PROVIDER_SITE_OTHER): Payer: PPO | Admitting: Family Medicine

## 2020-11-10 VITALS — BP 120/92 | HR 85 | Temp 97.3°F | Resp 16 | Ht 66.0 in | Wt 203.0 lb

## 2020-11-10 DIAGNOSIS — I10 Essential (primary) hypertension: Secondary | ICD-10-CM | POA: Diagnosis not present

## 2020-11-10 DIAGNOSIS — E782 Mixed hyperlipidemia: Secondary | ICD-10-CM | POA: Diagnosis not present

## 2020-11-10 DIAGNOSIS — E559 Vitamin D deficiency, unspecified: Secondary | ICD-10-CM

## 2020-11-10 DIAGNOSIS — Z7689 Persons encountering health services in other specified circumstances: Secondary | ICD-10-CM

## 2020-11-10 DIAGNOSIS — E291 Testicular hypofunction: Secondary | ICD-10-CM | POA: Diagnosis not present

## 2020-11-10 MED ORDER — AMLODIPINE BESYLATE 10 MG PO TABS: 10.0000 mg | ORAL_TABLET | Freq: Every day | ORAL | 1 refills | Status: AC

## 2020-11-10 NOTE — Progress Notes (Signed)
Discuss paperwork

## 2020-11-10 NOTE — Progress Notes (Signed)
New Patient Office Visit  Subjective:  Patient ID: Hector Ryan, male    DOB: February 26, 1956  Age: 65 y.o. MRN: [id number]  CC:  Chief Complaint  Patient presents with   Establish Care    HPI Hector Ryan presents for to establish care and for follow up of chronic med issues including hypertension and hyperlipidemia. Patient also reports that he take testosterone injections. He has not had labwork or follow up on that for awhile. He also reports vitamin D deficiency.   Past Medical History:  Diagnosis Date   Bronchitis    Hypertension    Insomnia    Mixed hyperlipidemia    Sinusitis    Vitamin D deficiency     Past Surgical History:  Procedure Laterality Date   ROTATOR CUFF REPAIR      Family History  Problem Relation Age of Onset   Heart disease Mother    Diabetes Father    Hypertension Father     Social History   Socioeconomic History   Marital status: Single    Spouse name: Not on file   Number of children: Not on file   Years of education: Not on file   Highest education level: Not on file  Occupational History   Not on file  Tobacco Use   Smoking status: Former    Types: Cigarettes    Quit date: 03/22/2008    Years since quitting: 12.6   Smokeless tobacco: Never  Substance and Sexual Activity   Alcohol use: Yes    Alcohol/week: 14.0 standard drinks    Types: 14 Standard drinks or equivalent per week   Drug use: Yes    Types: Marijuana   Sexual activity: Not on file  Other Topics Concern   Not on file  Social History Narrative   Not on file   Social Determinants of Health   Financial Resource Strain: Not on file  Food Insecurity: Not on file  Transportation Needs: Not on file  Physical Activity: Not on file  Stress: Not on file  Social Connections: Not on file  Intimate Partner Violence: Not on file    ROS Review of Systems  Constitutional:  Negative for fatigue.  Cardiovascular:  Negative for chest pain.  All other systems reviewed and  are negative.  Objective:   Today's Vitals: BP (!) 120/92 (BP Location: Right Arm, Patient Position: Sitting, Cuff Size: Large)   Pulse 85   Temp (!) 97.3 F (36.3 C)   Resp 16   Ht '5\' 6"'$  (1.676 m)   Wt 203 lb (92.1 kg)   SpO2 97%   BMI 32.77 kg/m   Physical Exam Vitals and nursing note reviewed.  Constitutional:      General: He is not in acute distress. Cardiovascular:     Rate and Rhythm: Normal rate and regular rhythm.  Pulmonary:     Effort: Pulmonary effort is normal.     Breath sounds: Normal breath sounds.  Abdominal:     Palpations: Abdomen is soft.     Tenderness: There is no abdominal tenderness.  Musculoskeletal:     Right lower leg: No edema.     Left lower leg: No edema.  Neurological:     General: No focal deficit present.     Mental Status: He is alert and oriented to person, place, and time. Mental status is at baseline.    Assessment & Plan:   1. Essential hypertension Appears stable - monitoring labs ordered. Meds refilled  -  Basic Metabolic Panel - AST - ALT - Lipid Panel  2. Mixed hyperlipidemia Monitoring labs ordered - continue present management - Basic Metabolic Panel - AST - ALT - Lipid Panel  3. Hypogonadism in male Monitoring labs ordered  - consider referral to urology for further eval/mgt pending results.  - Testosterone  4. Vitamin D deficiency Monitoring labs ordered - Vitamin D, 25-hydroxy  5. Encounter to establish care        Follow-up: Return in about 6 months (around 05/13/2021) for follow up, chronic med issues.   Becky Sax, MD

## 2020-11-13 DIAGNOSIS — M25511 Pain in right shoulder: Secondary | ICD-10-CM | POA: Diagnosis not present

## 2020-11-17 LAB — VITAMIN D 25 HYDROXY (VIT D DEFICIENCY, FRACTURES): Vit D, 25-Hydroxy: 59.4 ng/mL (ref 30.0–100.0)

## 2020-11-17 LAB — ALT: ALT: 40 IU/L (ref 0–44)

## 2020-11-17 LAB — BASIC METABOLIC PANEL
BUN/Creatinine Ratio: 9 — ABNORMAL LOW (ref 10–24)
BUN: 11 mg/dL (ref 8–27)
CO2: 22 mmol/L (ref 20–29)
Calcium: 9.4 mg/dL (ref 8.6–10.2)
Chloride: 97 mmol/L (ref 96–106)
Creatinine, Ser: 1.27 mg/dL (ref 0.76–1.27)
Glucose: 150 mg/dL — ABNORMAL HIGH (ref 65–99)
Potassium: 4.5 mmol/L (ref 3.5–5.2)
Sodium: 136 mmol/L (ref 134–144)
eGFR: 62 mL/min/{1.73_m2} (ref >59)

## 2020-11-17 LAB — LIPID PANEL
Chol/HDL Ratio: 5.3 ratio — ABNORMAL HIGH (ref 0.0–5.0)
Cholesterol, Total: 298 mg/dL — ABNORMAL HIGH (ref 100–199)
HDL: 56 mg/dL (ref >39)
LDL Chol Calc (NIH): 199 mg/dL — ABNORMAL HIGH (ref 0–99)
Triglycerides: 225 mg/dL — ABNORMAL HIGH (ref 0–149)
VLDL Cholesterol Cal: 43 mg/dL — ABNORMAL HIGH (ref 5–40)

## 2020-11-17 LAB — TESTOSTERONE: Testosterone: 402 ng/dL (ref 264–916)

## 2020-11-17 LAB — SPECIMEN STATUS REPORT

## 2020-11-17 LAB — AST: AST: 23 IU/L (ref 0–40)

## 2020-11-19 DIAGNOSIS — M75121 Complete rotator cuff tear or rupture of right shoulder, not specified as traumatic: Secondary | ICD-10-CM | POA: Diagnosis not present

## 2020-11-19 DIAGNOSIS — M25511 Pain in right shoulder: Secondary | ICD-10-CM | POA: Diagnosis not present

## 2020-11-25 ENCOUNTER — Telehealth: Payer: Self-pay | Admitting: Family Medicine

## 2020-11-25 DIAGNOSIS — M25519 Pain in unspecified shoulder: Secondary | ICD-10-CM | POA: Diagnosis not present

## 2020-11-25 DIAGNOSIS — M75121 Complete rotator cuff tear or rupture of right shoulder, not specified as traumatic: Secondary | ICD-10-CM | POA: Diagnosis not present

## 2020-11-25 NOTE — Telephone Encounter (Signed)
Pt last seen 11-10-20 states have not got any information about labs taken and wants to know why do they need to come into the office again for clearance and each time he comes in its a 30$ fee  this office have not done anything for him and does not know all his information for upcoming surgery so why should he need a clearance from this office and he does not wish to make an appt

## 2020-11-26 NOTE — Telephone Encounter (Signed)
Pls advise. Last OV and lab was 11/10/20.  No EKG on file.

## 2020-11-27 NOTE — Telephone Encounter (Signed)
Patient states he has an appt already scheduled to get the medical clearance taken care of.

## 2020-12-18 DIAGNOSIS — Y999 Unspecified external cause status: Secondary | ICD-10-CM | POA: Diagnosis not present

## 2020-12-18 DIAGNOSIS — M19011 Primary osteoarthritis, right shoulder: Secondary | ICD-10-CM | POA: Diagnosis not present

## 2020-12-18 DIAGNOSIS — S46011A Strain of muscle(s) and tendon(s) of the rotator cuff of right shoulder, initial encounter: Secondary | ICD-10-CM | POA: Diagnosis not present

## 2020-12-18 DIAGNOSIS — S43491A Other sprain of right shoulder joint, initial encounter: Secondary | ICD-10-CM | POA: Diagnosis not present

## 2020-12-18 DIAGNOSIS — M7541 Impingement syndrome of right shoulder: Secondary | ICD-10-CM | POA: Diagnosis not present

## 2020-12-18 DIAGNOSIS — X58XXXA Exposure to other specified factors, initial encounter: Secondary | ICD-10-CM | POA: Diagnosis not present

## 2020-12-18 DIAGNOSIS — G8918 Other acute postprocedural pain: Secondary | ICD-10-CM | POA: Diagnosis not present

## 2020-12-25 DIAGNOSIS — M25611 Stiffness of right shoulder, not elsewhere classified: Secondary | ICD-10-CM | POA: Diagnosis not present

## 2020-12-25 DIAGNOSIS — M25511 Pain in right shoulder: Secondary | ICD-10-CM | POA: Diagnosis not present

## 2020-12-31 DIAGNOSIS — Z4789 Encounter for other orthopedic aftercare: Secondary | ICD-10-CM | POA: Diagnosis not present

## 2021-01-19 DIAGNOSIS — M75120 Complete rotator cuff tear or rupture of unspecified shoulder, not specified as traumatic: Secondary | ICD-10-CM | POA: Diagnosis not present

## 2021-01-19 DIAGNOSIS — I10 Essential (primary) hypertension: Secondary | ICD-10-CM | POA: Diagnosis not present

## 2021-01-21 DIAGNOSIS — M25611 Stiffness of right shoulder, not elsewhere classified: Secondary | ICD-10-CM | POA: Diagnosis not present

## 2021-01-21 DIAGNOSIS — M25511 Pain in right shoulder: Secondary | ICD-10-CM | POA: Diagnosis not present

## 2021-01-27 DIAGNOSIS — H5203 Hypermetropia, bilateral: Secondary | ICD-10-CM | POA: Diagnosis not present

## 2021-01-27 DIAGNOSIS — H52223 Regular astigmatism, bilateral: Secondary | ICD-10-CM | POA: Diagnosis not present

## 2021-01-27 DIAGNOSIS — H40013 Open angle with borderline findings, low risk, bilateral: Secondary | ICD-10-CM | POA: Diagnosis not present

## 2021-01-27 DIAGNOSIS — H524 Presbyopia: Secondary | ICD-10-CM | POA: Diagnosis not present

## 2021-02-10 DIAGNOSIS — K649 Unspecified hemorrhoids: Secondary | ICD-10-CM | POA: Diagnosis not present

## 2021-02-10 DIAGNOSIS — D123 Benign neoplasm of transverse colon: Secondary | ICD-10-CM | POA: Diagnosis not present

## 2021-02-10 DIAGNOSIS — K5730 Diverticulosis of large intestine without perforation or abscess without bleeding: Secondary | ICD-10-CM | POA: Diagnosis not present

## 2021-02-10 DIAGNOSIS — Z86010 Personal history of colonic polyps: Secondary | ICD-10-CM | POA: Diagnosis not present

## 2021-02-16 DIAGNOSIS — D123 Benign neoplasm of transverse colon: Secondary | ICD-10-CM | POA: Diagnosis not present

## 2021-02-19 DIAGNOSIS — M25611 Stiffness of right shoulder, not elsewhere classified: Secondary | ICD-10-CM | POA: Diagnosis not present

## 2021-02-19 DIAGNOSIS — M25511 Pain in right shoulder: Secondary | ICD-10-CM | POA: Diagnosis not present

## 2021-07-03 ENCOUNTER — Ambulatory Visit: Payer: PPO

## 2022-01-01 ENCOUNTER — Ambulatory Visit: Payer: PPO | Attending: Cardiovascular Disease | Admitting: Cardiology

## 2022-01-01 ENCOUNTER — Encounter: Payer: Self-pay | Admitting: Cardiology

## 2022-01-01 VITALS — BP 145/82 | HR 64 | Ht 66.0 in | Wt 210.6 lb

## 2022-01-01 DIAGNOSIS — R0602 Shortness of breath: Secondary | ICD-10-CM

## 2022-01-01 DIAGNOSIS — E785 Hyperlipidemia, unspecified: Secondary | ICD-10-CM

## 2022-01-01 MED ORDER — METOPROLOL TARTRATE 100 MG PO TABS: ORAL_TABLET | ORAL | 0 refills | Status: DC

## 2022-01-01 MED ORDER — PRAVASTATIN SODIUM 20 MG PO TABS: 20.0000 mg | ORAL_TABLET | Freq: Every evening | ORAL | 3 refills | Status: AC

## 2022-01-01 NOTE — Patient Instructions (Signed)
Medication Instructions:   STOP RED YEAST RICE  START PRAVASTATIN 20 MG ONCE DAILY  *If you need a refill on your cardiac medications before your next appointment, please call your pharmacy*   Lab Work:  Your physician recommends that you return for lab work in: [place]  If you have labs (blood work) drawn today and your tests are completely normal, you will receive your results only by: [place] (if you have MyChart) OR A paper copy in the mail If you have any lab test that is abnormal or we need to change your treatment, we will call you to review the results.   Testing/Procedures:     Your cardiac CT will be scheduled at one of the below locations:   [facility] [address] [place], [place] [address] [phone]   If scheduled at [facility], please arrive at the [facility] Entrance (Entrance C2) of [facility] 30 minutes prior to test start time. You can use the FREE valet parking offered at entrance C (encouraged to control the heart rate for the test)  Proceed to the [facility] Radiology Department (first floor) to check-in and test prep.  All radiology patients and guests should use entrance C2 at [facility], accessed from [facility], even though the hospital's physical address listed is [address]     Please follow these instructions carefully (unless otherwise directed):  Hold all erectile dysfunction medications at least 3 days (72 hrs) prior to test. (Ie viagra, cialis, sildenafil, tadalafil, etc) We will administer nitroglycerin during this exam.   On the Night Before the Test: Be sure to Drink plenty of water. Do not consume any caffeinated/decaffeinated beverages or chocolate 12 hours prior to your test. Do not take any antihistamines 12 hours prior to your test.   On the Day of the Test: Drink plenty of water until 1 hour prior to the test. Do not eat  any food 1 hour prior to test. You may take your regular medications prior to the test.  Take metoprolol (Lopressor) 100 MG two hours prior to test.    After the Test: Drink plenty of water. After receiving IV contrast, you may experience a mild flushed feeling. This is normal. On occasion, you may experience a mild rash up to 24 hours after the test. This is not dangerous. If this occurs, you can take Benadryl 25 mg and increase your fluid intake. If you experience trouble breathing, this can be serious. If it is severe call 911 IMMEDIATELY. If it is mild, please call our office.   We will call to schedule your test 2-4 weeks out understanding that some insurance companies will need an authorization prior to the service being performed.   For non-scheduling related questions, please contact the cardiac imaging nurse navigator should you have any questions/concerns: Marchia Bond, Cardiac Imaging Nurse Navigator Gordy Clement, Cardiac Imaging Nurse Navigator [facility] Direct Office Dial: [phone]   For scheduling needs, including cancellations and rescheduling, please call [place], [phone].    Follow-Up: At [facility], you and your health needs are our priority.  As part of our continuing mission to provide you with exceptional heart care, we have created designated Provider Care Teams.  These Care Teams include your primary Cardiologist (physician) and Advanced Practice Providers (APPs -  Physician Assistants and Nurse Practitioners) who all work together to provide you with the care you need,  when you need it.  We recommend signing up for the patient portal called "MyChart".  Sign up information is provided on this After Visit Summary.  MyChart is used to connect with patients for Virtual Visits (Telemedicine).  Patients are able to view lab/test results, encounter notes, upcoming appointments, etc.  Non-urgent messages can be sent to your  provider as well.   To learn more about what you can do with MyChart, go to [email or web address].    Your next appointment:   2 month(s)  The format for your next appointment:   In Person  Provider:   [place] Your physician recommends that you schedule a follow-up appointment WITH DR [facility] AFTER CT COMPLETE

## 2022-01-01 NOTE — Progress Notes (Signed)
Cardiology Office Note   Date:  01/01/2022   ID:  EINER MEALS, DOB 1955/07/01, MRN [id number]  PCP:  Herbert Deaner, FNP  Cardiologist:   None Referring:  Herbert Deaner, FNP  Chief Complaint  Patient presents with   Shortness of Breath      History of Present Illness: Hector Ryan is a 66 y.o. male who presents for evaluation of shortness of breath.  He said no prior cardiac history but he has significant family history and severe dyslipidemia.  He has been intolerant of statins and only takes red yeast rice.  He has been getting progressively more short of breath over about a year.  Now he is short of breath walking 100 yards with climbing a flight of stairs.  He may have to wait 10 minutes to recover.  He is not describing substernal chest pressure, neck or arm discomfort.  He is not having any palpitations, presyncope or syncope.  He denies PND or orthopnea.  He has not had any prior cardiac work-up.  He does snore but has had negative sleep study in the past.   Past Medical History:  Diagnosis Date   Bronchitis    Hypertension    Insomnia    Mixed hyperlipidemia    Sinusitis    Vitamin D deficiency     Past Surgical History:  Procedure Laterality Date   ROTATOR CUFF REPAIR       Current Outpatient Medications  Medication Sig Dispense Refill   albuterol (PROVENTIL HFA;VENTOLIN HFA) 108 (90 Base) MCG/ACT inhaler Inhale into the lungs every 6 (six) hours as needed for wheezing or shortness of breath.     amLODipine (NORVASC) 10 MG tablet Take 1 tablet (10 mg total) by mouth daily. 90 tablet 1   Cholecalciferol (VITAMIN D3) 1000 units CAPS Take by mouth.     fenofibrate 160 MG tablet Take 160 mg by mouth daily.     finasteride (PROPECIA) 1 MG tablet Take 1 mg by mouth daily.  12   HYDROcodone-acetaminophen (NORCO/VICODIN) 5-325 MG tablet as needed.     meloxicam (MOBIC) 15 MG tablet as needed.     methocarbamol (ROBAXIN) 500 MG tablet Take 1 tablet by  mouth as needed.     metoprolol tartrate (LOPRESSOR) 100 MG tablet TAKE 2 HOURS PRIOR TO CT SCAN 1 tablet 0   Omega-3 Fatty Acids (FISH OIL PO) Take by mouth daily.     oxyCODONE (OXY IR/ROXICODONE) 5 MG immediate release tablet as needed.     pravastatin (PRAVACHOL) 20 MG tablet Take 1 tablet (20 mg total) by mouth every evening. 90 tablet 3   sildenafil (REVATIO) 20 MG tablet Take 20 mg by mouth 3 (three) times daily.     tamsulosin (FLOMAX) 0.4 MG CAPS capsule Take 0.4 mg by mouth.     testosterone cypionate (DEPOTESTOSTERONE CYPIONATE) 200 MG/ML injection Inject into the muscle every 28 (twenty-eight) days.     traMADol (ULTRAM) 50 MG tablet as needed.     traZODone (DESYREL) 50 MG tablet Take 50 mg by mouth at bedtime.     VALACYCLOVIR HCL PO Take by mouth as needed.     No current facility-administered medications for this visit.    Allergies:   Statins    Social History:  The patient  reports that he quit smoking about 13 years ago. His smoking use included cigarettes. He has never used smokeless tobacco. He reports current alcohol use of about 14.0 standard  drinks of alcohol per week. He reports current drug use. Drug: Marijuana.   Family History:  The patient's family history includes Diabetes in his father; Heart disease (age of onset: 11) in his mother; Hypertension in his father.    ROS:  Please see the history of present illness.   Otherwise, review of systems are positive for none.   All other systems are reviewed and negative.    PHYSICAL EXAM: VS:  BP (!) 145/82   Pulse 64   Ht '5\' 6"'$  (1.676 m)   Wt 210 lb 9.6 oz (95.5 kg)   SpO2 97%   BMI 33.99 kg/m  , BMI Body mass index is 33.99 kg/m. GENERAL:  Well appearing HEENT:  Pupils equal round and reactive, fundi not visualized, oral mucosa unremarkable NECK:  No jugular venous distention, waveform within normal limits, carotid upstroke brisk and symmetric, no bruits, no thyromegaly LYMPHATICS:  No cervical, inguinal  adenopathy LUNGS:  Clear to auscultation bilaterally BACK:  No CVA tenderness CHEST:  Unremarkable HEART:  PMI not displaced or sustained,S1 and S2 within normal limits, no S3, no S4, no clicks, no rubs, no murmurs ABD:  Flat, positive bowel sounds normal in frequency in pitch, no bruits, no rebound, no guarding, no midline pulsatile mass, no hepatomegaly, no splenomegaly EXT:  2 plus pulses throughout, no edema, no cyanosis no clubbing SKIN:  No rashes no nodules NEURO:  Cranial nerves II through XII grossly intact, motor grossly intact throughout PSYCH:  Cognitively intact, oriented to person place and time    EKG:  EKG is ordered today. The ekg ordered today demonstrates sinus rhythm, rate 64, axis within normal limits, intervals within normal limits, no acute ST-T wave changes.   Recent Labs: No results found for requested labs within last 365 days.    Lipid Panel    Component Value Date/Time   CHOL 298 (H) 11/10/2020 1056   TRIG 225 (H) 11/10/2020 1056   HDL 56 11/10/2020 1056   CHOLHDL 5.3 (H) 11/10/2020 1056   LDLCALC 199 (H) 11/10/2020 1056      Wt Readings from Last 3 Encounters:  01/01/22 210 lb 9.6 oz (95.5 kg)  11/10/20 203 lb (92.1 kg)  06/16/15 206 lb (93.4 kg)      Other studies Reviewed: Additional studies/ records that were reviewed today include: Labs. Review of the above records demonstrates:  Please see elsewhere in the note.     ASSESSMENT AND PLAN:  SOB: This certainly could be an anginal equivalent.  He has significant cardiovascular risk factors.  He would not be able to walk on a treadmill.  I am going to screen him with a coronary CTA.  Regardless of the findings and he needs aggressive risk reduction.  HTN: His blood pressure is borderline.  I have suggested 5 pound weight loss and diet change and to follow blood pressure diary.  Dyslipidemia: He does not tolerate statins.  I convinced him to stop the red yeast rice and at least try  pravastatin 20 mg and I will check a lipid profile in 2 months.  I do not suspect that he will be at target and I will go ahead and schedule him to be seen in our lipid clinic to start PCSK9.  I also like to have him in 2 months get an LP(a).  Obesity: We talked about plant-based Mediterranean diet.   Current medicines are reviewed at length with the patient today.  The patient has concerns regarding medicines.  The  following changes have been made: As above  Labs/ tests ordered today include:   Orders Placed This Encounter  Procedures   CT CORONARY MORPH W/CTA COR W/SCORE W/CA W/CM &/OR WO/CM   Lipid panel   Lipoprotein A (LPA)   EKG 12-Lead     Disposition:   FU with me after the CT. of the GI   Signed, Minus Breeding, MD  01/01/2022 5:37 PM    [place]

## 2022-01-13 ENCOUNTER — Telehealth (HOSPITAL_COMMUNITY): Payer: Self-pay | Admitting: *Deleted

## 2022-01-13 NOTE — Telephone Encounter (Signed)
Reaching out to patient to offer assistance regarding upcoming cardiac imaging study; pt verbalizes understanding of appt date/time, parking situation and where to check in, medications ordered, and verified current allergies; name and call back number provided for further questions should they arise  Gordy Clement RN Navigator Cardiac Imaging [facility] [phone] office [phone] cell  Patient to take '50mg'$  metoprolol tartrate two hours prior to his cardiac CT scan.  He is aware to arrive at 2pm.

## 2022-01-14 ENCOUNTER — Ambulatory Visit (HOSPITAL_COMMUNITY)
Admission: RE | Admit: 2022-01-14 | Discharge: 2022-01-14 | Disposition: A | Discharge status: Home / Self Care | Payer: PPO | Source: Ambulatory Visit | Attending: Cardiology | Admitting: Cardiology

## 2022-01-14 DIAGNOSIS — I2510 Atherosclerotic heart disease of native coronary artery without angina pectoris: Secondary | ICD-10-CM | POA: Insufficient documentation

## 2022-01-14 DIAGNOSIS — R0602 Shortness of breath: Secondary | ICD-10-CM | POA: Insufficient documentation

## 2022-01-14 MED ORDER — IOHEXOL 350 MG/ML SOLN
100.0000 mL | Freq: Once | INTRAVENOUS | Status: AC | PRN
  Administered 2022-01-14: 100 mL via INTRAVENOUS

## 2022-01-14 MED ORDER — NITROGLYCERIN 0.4 MG SL SUBL
SUBLINGUAL_TABLET | SUBLINGUAL | Status: AC
  Filled 2022-01-14: qty 2

## 2022-01-14 MED ORDER — NITROGLYCERIN 0.4 MG SL SUBL
0.8000 mg | SUBLINGUAL_TABLET | Freq: Once | SUBLINGUAL | Status: AC
  Administered 2022-01-14: 0.8 mg via SUBLINGUAL

## 2022-01-18 ENCOUNTER — Encounter: Payer: Self-pay | Admitting: *Deleted

## 2022-01-19 ENCOUNTER — Ambulatory Visit: Payer: PPO | Admitting: Cardiovascular Disease

## 2022-02-25 LAB — LIPID PANEL
Chol/HDL Ratio: 4.3 ratio (ref 0.0–5.0)
Cholesterol, Total: 215 mg/dL — ABNORMAL HIGH (ref 100–199)
HDL: 50 mg/dL (ref >39)
LDL Chol Calc (NIH): 135 mg/dL — ABNORMAL HIGH (ref 0–99)
Triglycerides: 170 mg/dL — ABNORMAL HIGH (ref 0–149)
VLDL Cholesterol Cal: 30 mg/dL (ref 5–40)

## 2022-02-25 LAB — LIPOPROTEIN A (LPA): Lipoprotein (a): 46 nmol/L (ref <75.0)

## 2022-03-01 ENCOUNTER — Ambulatory Visit: Payer: PPO | Attending: Cardiology | Admitting: Pharmacist Clinician (PhC)/ Clinical Pharmacy Specialist

## 2022-03-01 ENCOUNTER — Encounter: Payer: Self-pay | Admitting: Pharmacist Clinician (PhC)/ Clinical Pharmacy Specialist

## 2022-03-01 VITALS — Ht 66.0 in | Wt 209.0 lb

## 2022-03-01 DIAGNOSIS — E785 Hyperlipidemia, unspecified: Secondary | ICD-10-CM

## 2022-03-01 NOTE — Progress Notes (Signed)
Office Visit    Patient Name: Hector Ryan Date of Encounter: 03/01/2022  Primary Care Provider:  Herbert Deaner, FNP Primary Cardiologist:  Minus Breeding MD  Chief Complaint    Hyperlipidemia - familial  Significant Past Medical History   SOB Progressive worsening, occurs with stair climb or100 yds walk, up to 10 min to recover  HTN Working on lifestyle modifications, on   Vit D deficiency 8/22 at 59.4, on daily supplementation  obesity Working on lifestyle modifications - mostly dietary changes     Allergies  Allergen Reactions   Statins     History of Present Illness    Hector Ryan is a 66 y.o. male patient of Dr Percival Spanish, in the office today with his significant other, to discuss options for cholesterol lowering.  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  He had been only on red yeast rice, but Dr. Percival Spanish instead gave him pravastatin 20 mg.  It has brought the LDL down to 135, however a recent Coronary CT showed a calcium score of 227 (64th percentile), making his LDL goal < 70.    Insurance Carrier:  [employer] Plan 41f  Repatha $47  (coverage gap 135) $94 for 3 months   Nexlezet  $100 (coverage gap $98)  $200 for 3 months  LDL Cholesterol goal:  LDL < 70  Current Medications:   pravastatin 20 mg qd  Previously tried:  atorvastatin, rosuvastatin, simvastatin - leg pain/cramping  Family Hx:  mother smoked, DM, MI, stents; leg amputation, died in early 750's ; father died complications from DM at 581 2 brothers, both overall healthy; no children  Social Hx: Tobacco: none, quit 15 years ago Alcohol: beers most afternoons 1-2 day at the most     Diet:   now eating more vegetables, meats are moving more towards salmon/fish from red meat; 2-3 eggs for breakfast, adding in oatmeal some days; no regular snacking   Exercise: yard work, gardens, has bike, no specific cardio    Accessory Clinical Findings    Lab Results  Component Value Date   CHOL 215 (H) 02/24/2022   HDL 50 02/24/2022   LDLCALC 135 (H) 02/24/2022   TRIG 170 (H) 02/24/2022   CHOLHDL 4.3 02/24/2022    Lab Results  Component Value Date   ALT 40 11/10/2020   AST 23 11/10/2020   Lab Results  Component Value Date   CREATININE 1.27 11/10/2020   BUN 11 11/10/2020   NA 136 11/10/2020   K 4.5 11/10/2020   CL 97 11/10/2020   CO2 22 11/10/2020   No results found for: "HGBA1C"  Home Medications    Current Outpatient Medications  Medication Sig Dispense Refill   albuterol (PROVENTIL HFA;VENTOLIN HFA) 108 (90 Base) MCG/ACT inhaler Inhale into the lungs every 6 (six) hours as needed for wheezing or shortness of breath.     amLODipine (NORVASC) 10 MG tablet Take 1 tablet (10 mg total) by mouth daily. 90 tablet 1   Cholecalciferol (VITAMIN D3) 1000 units CAPS Take by mouth.     fenofibrate 160 MG tablet Take 160 mg by mouth daily.     finasteride (PROPECIA) 1 MG tablet Take 1 mg by mouth daily.  12   HYDROcodone-acetaminophen (NORCO/VICODIN) 5-325 MG tablet as needed.     meloxicam (MOBIC) 15 MG tablet as needed.     methocarbamol (ROBAXIN) 500 MG tablet Take 1 tablet by mouth  as needed.     Omega-3 Fatty Acids (FISH OIL PO) Take by mouth daily.     pravastatin (PRAVACHOL) 20 MG tablet Take 1 tablet (20 mg total) by mouth every evening. 90 tablet 3   sildenafil (REVATIO) 20 MG tablet Take 20 mg by mouth 3 (three) times daily.     tamsulosin (FLOMAX) 0.4 MG CAPS capsule Take 0.4 mg by mouth.     testosterone cypionate (DEPOTESTOSTERONE CYPIONATE) 200 MG/ML injection Inject into the muscle every 28 (twenty-eight) days.     traZODone (DESYREL) 50 MG tablet Take 50 mg by mouth at bedtime.     VALACYCLOVIR HCL PO Take by mouth as needed.     No current facility-administered medications for this visit.     Assessment & Plan    Hyperlipidemia LDL goal <70 Assessment:  LDL goal: < 70 mg/dl last LDLc 135 mg/dl  (02/2022) Tolerates low intensity statin - pravastatin 20 mg, did see 32% drop in LDL, but not at goal Intolerance to high intensity statins  Discussed next potential options (PCSK-9 inhibitors, bempedoic acid and inclisiran); mechanism of action, dosing, efficacy, side effects and costs.    Plan: Continue taking current medications (pravastatin 20 mg) Will apply for PA for PCSK9i; will inform patient upon approval (prefers MyChart message) Repatha is preferred medication on [facility] lab due in 2-3 months after starting [place], PharmD CPP [facility] [address] [place], [place] [address] 03/01/2022, 12:01 PM

## 2022-03-01 NOTE — Assessment & Plan Note (Signed)
Assessment:  LDL goal: < 70 mg/dl last LDLc 135 mg/dl (02/2022) Tolerates low intensity statin - pravastatin 20 mg, did see 32% drop in LDL, but not at goal Intolerance to high intensity statins  Discussed next potential options (PCSK-9 inhibitors, bempedoic acid and inclisiran); mechanism of action, dosing, efficacy, side effects and costs.    Plan: Continue taking current medications (pravastatin 20 mg) Will apply for PA for PCSK9i; will inform patient upon approval (prefers MyChart message) Repatha is preferred medication on [employer] Lipid lab due in 2-3 months after starting [facility]

## 2022-03-01 NOTE — Patient Instructions (Signed)
Your Results:             Your most recent labs Goal  Total Cholesterol 215 < 200  Triglycerides 170 < 150  HDL (happy/good cholesterol) 50 > 40  LDL (lousy/bad cholesterol 135 < 70   Medication changes:  We will start the process to get Repatha covered by your insurance.  Once the prior authorization is complete, I will send you a MyChart message to let you know and confirm pharmacy information.   You will take one injection every 14 days.    Lab orders:  We want to repeat labs after 2-3 months.  We will send you a lab order to remind you once we get closer to that time.    Patient Assistance:  The [employer] foundation offers assistance to help pay for medication copays.  They will cover copays for all cholesterol lowering meds, including statins, fibrates, omega-3 oils, ezetimibe, Repatha, Praluent, Nexletol, Nexlizet.  The cards are usually good for $2,500 or 12 months, whichever comes first. Go to healthwellfoundation.org Click on "Apply Now" Answer questions as to whom is applying (patient or representative) Your disease fund will be "hypercholesterolemia - Medicare access" Select the cholesterol medication you need assistance with (Repatha, Praluent, Nexlizet...) They will ask question about qualifying diagnosis - you can mark "yes"; and do you have insurance coverage.   When they ask what type of assistance you are interested in - "copay assistance" When you submit, the approval is usually within minutes.  You will need to print the card information from the site You will need to show this information to your pharmacy, they will bill your Medicare Part D plan first -then bill [employer] --for the copay.   You can also call them at [phone], although the hold times can be quite long.   Thank you for choosing [facility]

## 2022-03-05 ENCOUNTER — Encounter: Payer: Self-pay | Admitting: Pharmacist Clinician (PhC)/ Clinical Pharmacy Specialist

## 2022-03-05 MED ORDER — REPATHA SURECLICK 140 MG/ML ~~LOC~~ SOAJ: 140.0000 mg | SUBCUTANEOUS | 12 refills | Status: AC

## 2022-05-24 ENCOUNTER — Other Ambulatory Visit (HOSPITAL_BASED_OUTPATIENT_CLINIC_OR_DEPARTMENT_OTHER): Payer: Self-pay | Admitting: Pharmacist Clinician (PhC)/ Clinical Pharmacy Specialist

## 2022-05-24 DIAGNOSIS — E785 Hyperlipidemia, unspecified: Secondary | ICD-10-CM

## 2022-05-24 NOTE — Progress Notes (Signed)
Lipid, Lp(a) ordered

## 2023-03-25 IMAGING — DX DG CHEST 2V
2 series · 2 of 2 positions shown · non-contrast
Comparison: None.

CLINICAL DATA: Cough and congestion.

EXAM:
CHEST - 2 VIEW

[chest pa]
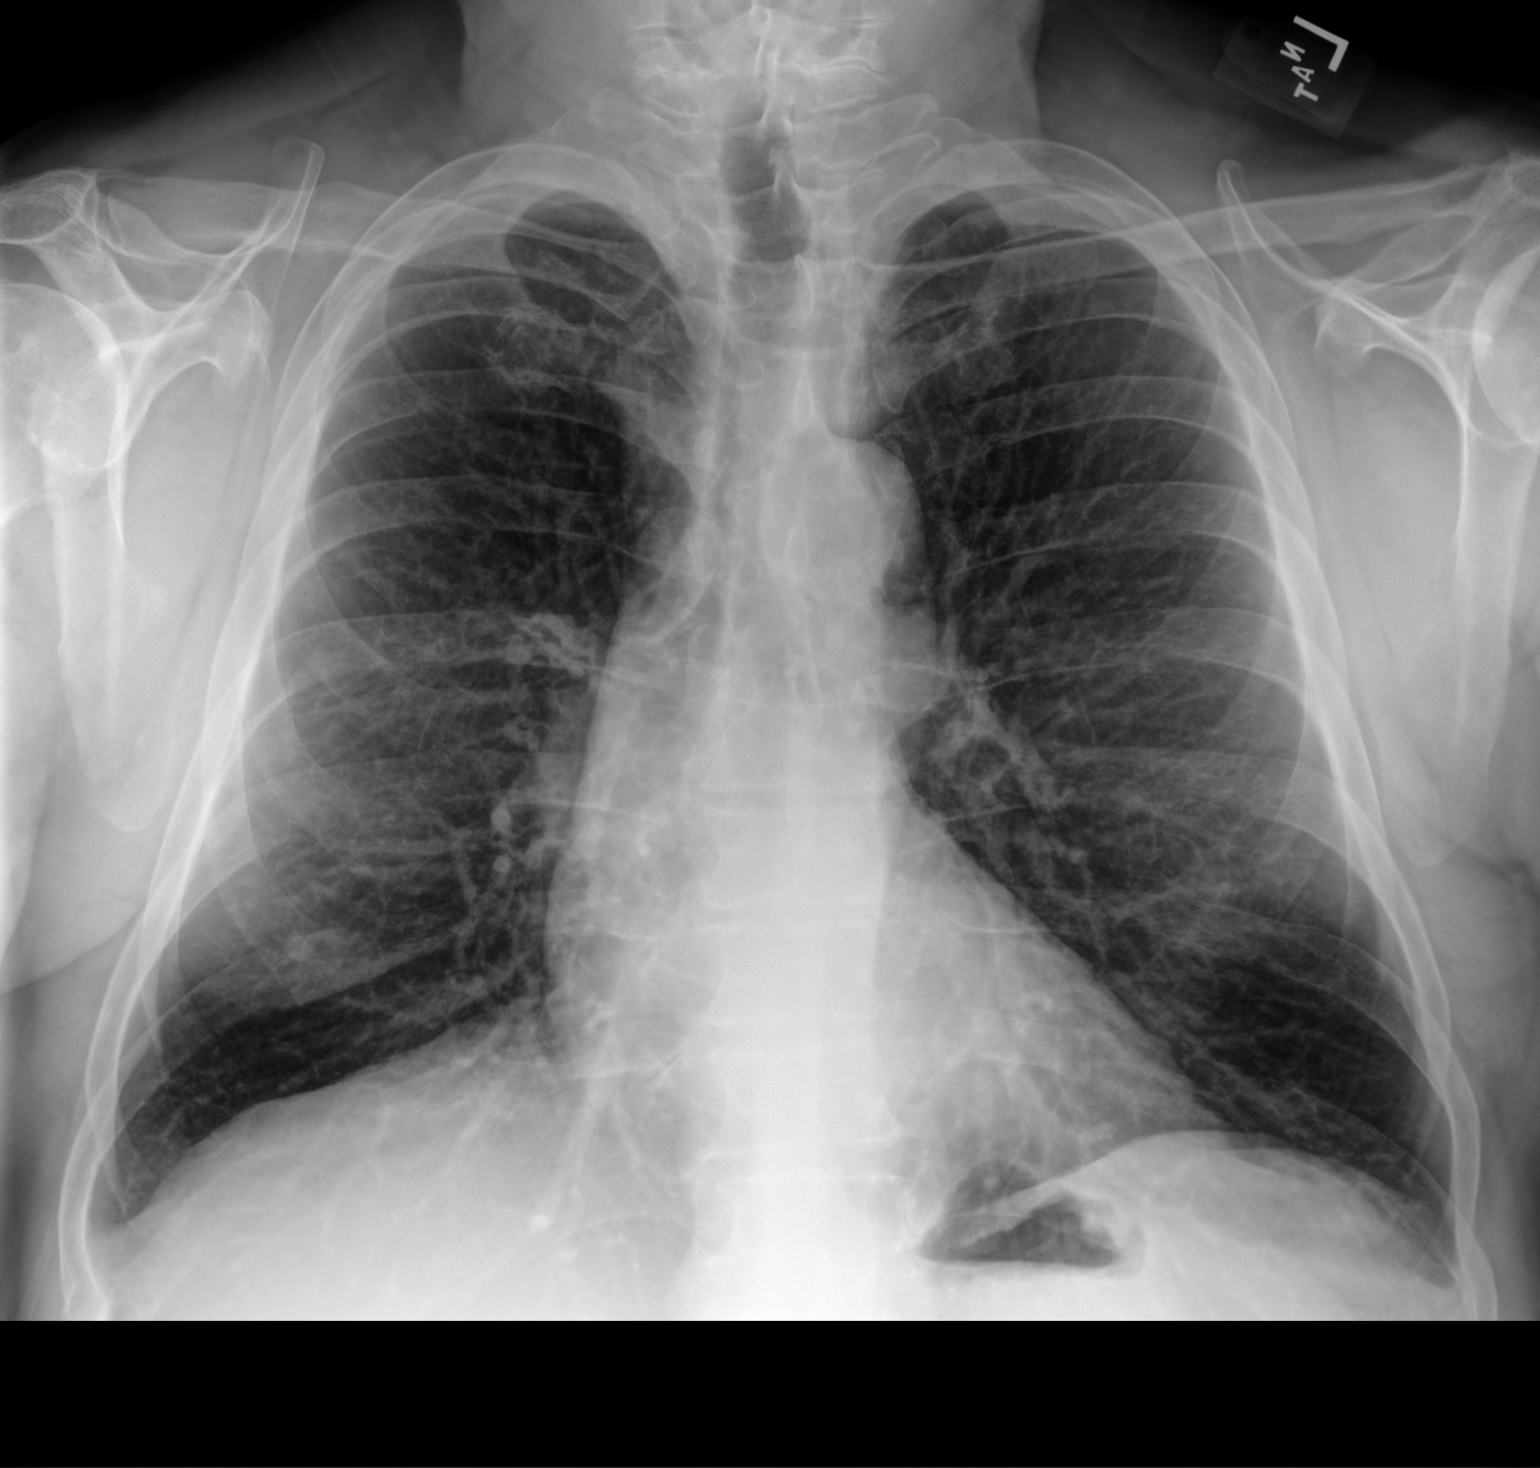

[chest lat]
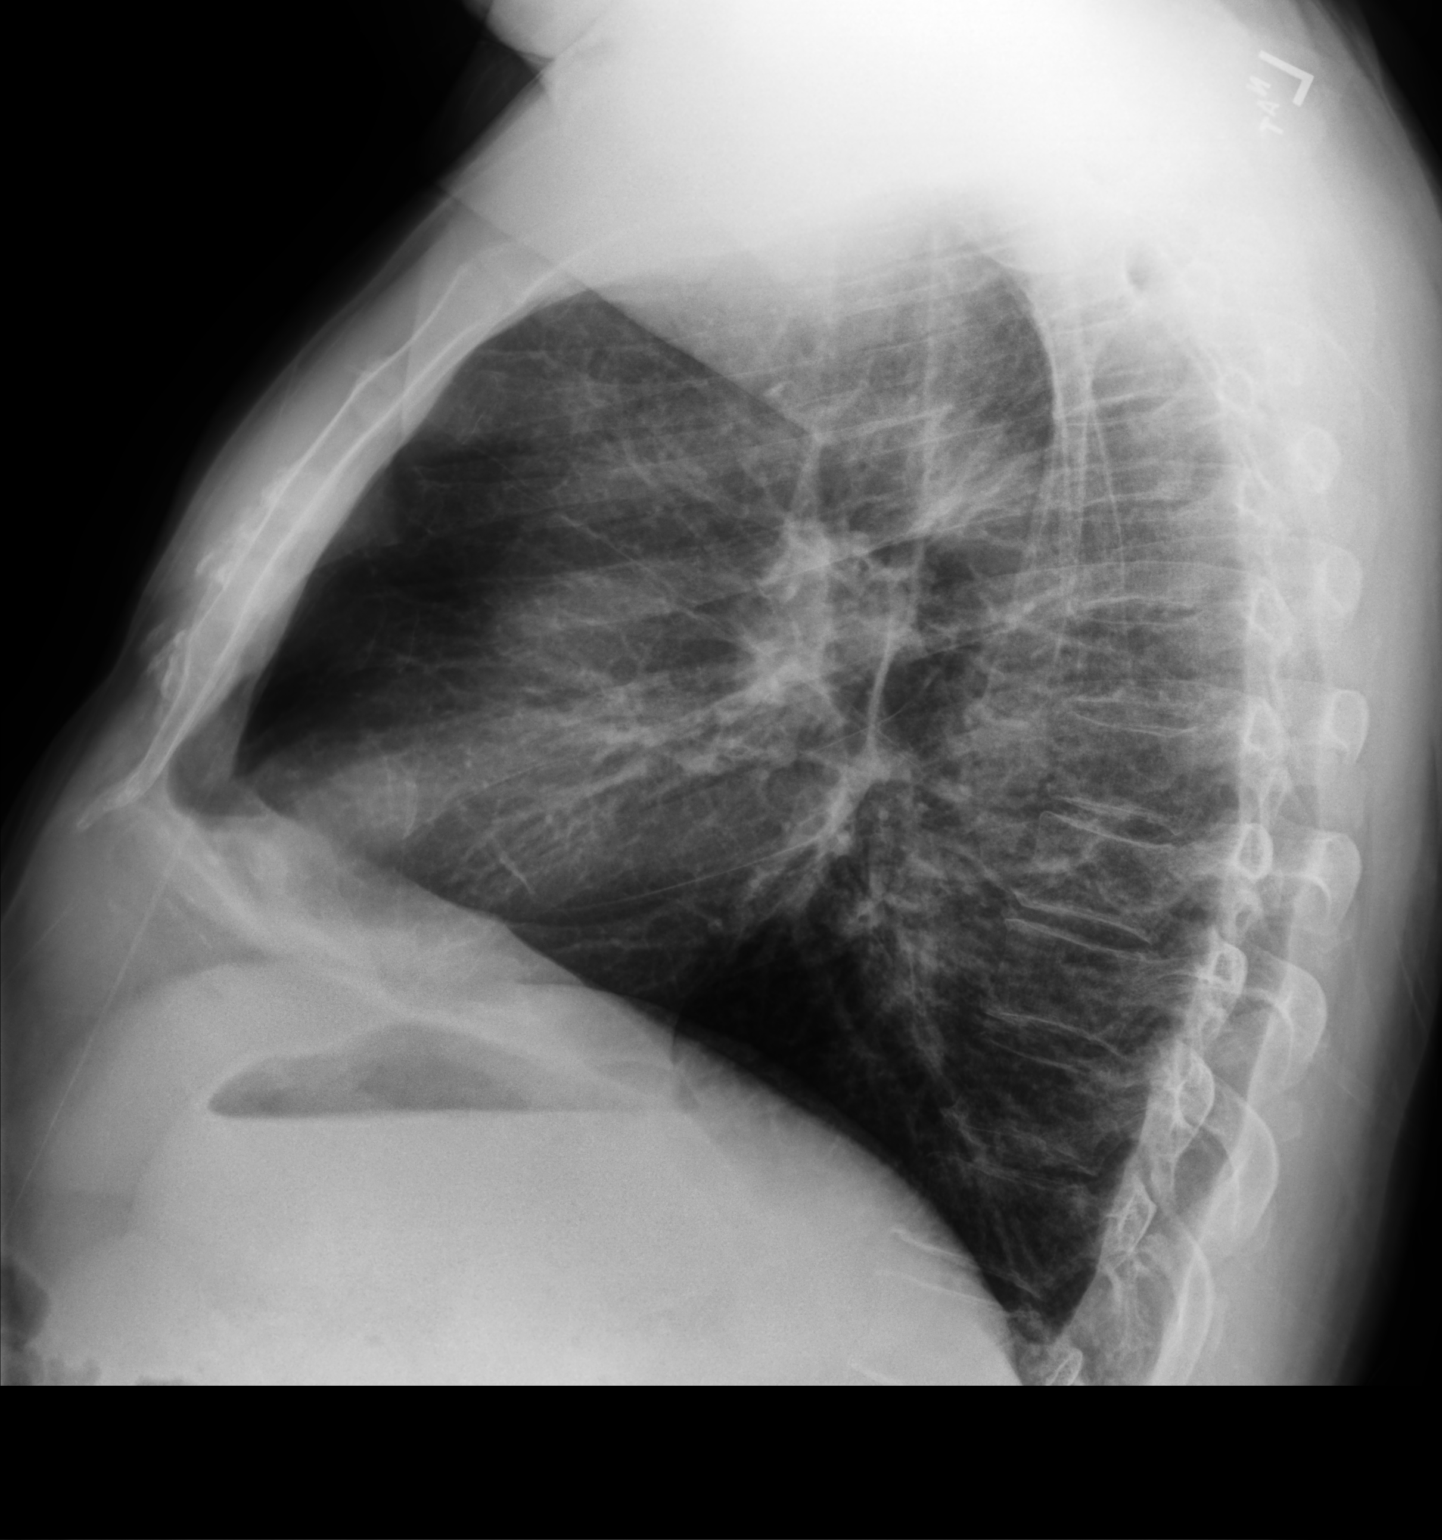

[2 of 2 positions shown; findings below may reference images not displayed]

FINDINGS: The cardiac silhouette, mediastinal and hilar contours are normal.
The lungs are clear. No pleural effusions. No pulmonary lesions. The
bony thorax is intact.
IMPRESSION: No acute cardiopulmonary findings.
